# Patient Record
Sex: Male | Born: 1985 | Race: Black or African American | Hispanic: No | Marital: Single | Smoking: Current every day smoker
Health system: Southern US, Community
[De-identification: ages and names within clinical notes are randomized; demographics above are authoritative.]

## PROBLEM LIST (undated history)

## (undated) DIAGNOSIS — F1910 Other psychoactive substance abuse, uncomplicated: Secondary | ICD-10-CM

## (undated) DIAGNOSIS — S82309A Unspecified fracture of lower end of unspecified tibia, initial encounter for closed fracture: Secondary | ICD-10-CM

## (undated) DIAGNOSIS — S82831A Other fracture of upper and lower end of right fibula, initial encounter for closed fracture: Secondary | ICD-10-CM

## (undated) DIAGNOSIS — Z8619 Personal history of other infectious and parasitic diseases: Secondary | ICD-10-CM

## (undated) HISTORY — PX: OTHER SURGICAL HISTORY: SHX169

---

## 2011-12-27 ENCOUNTER — Encounter (HOSPITAL_BASED_OUTPATIENT_CLINIC_OR_DEPARTMENT_OTHER): Payer: Self-pay | Admitting: *Deleted

## 2011-12-27 ENCOUNTER — Emergency Department (HOSPITAL_BASED_OUTPATIENT_CLINIC_OR_DEPARTMENT_OTHER)
Admission: EM | Admit: 2011-12-27 | Discharge: 2011-12-27 | Disposition: A | Discharge status: Home / Self Care | Payer: Self-pay | Attending: Emergency Medicine | Admitting: Emergency Medicine

## 2011-12-27 DIAGNOSIS — R05 Cough: Secondary | ICD-10-CM | POA: Insufficient documentation

## 2011-12-27 DIAGNOSIS — R0602 Shortness of breath: Secondary | ICD-10-CM | POA: Insufficient documentation

## 2011-12-27 DIAGNOSIS — R0600 Dyspnea, unspecified: Secondary | ICD-10-CM

## 2011-12-27 DIAGNOSIS — F17200 Nicotine dependence, unspecified, uncomplicated: Secondary | ICD-10-CM | POA: Insufficient documentation

## 2011-12-27 DIAGNOSIS — Z77098 Contact with and (suspected) exposure to other hazardous, chiefly nonmedicinal, chemicals: Secondary | ICD-10-CM

## 2011-12-27 DIAGNOSIS — R059 Cough, unspecified: Secondary | ICD-10-CM | POA: Insufficient documentation

## 2011-12-27 LAB — POCT I-STAT 3, ART BLOOD GAS (G3+)
Acid-base deficit: 2 mmol/L (ref 0.0–2.0)
Bicarbonate: 22.5 meq/L (ref 20.0–24.0)
O2 Saturation: 99 %
Patient temperature: 98.7
TCO2: 24 mmol/L (ref 0–100)
pCO2 arterial: 36.1 mmHg (ref 35.0–45.0)
pH, Arterial: 7.403 (ref 7.350–7.450)
pO2, Arterial: 121 mmHg — ABNORMAL HIGH (ref 80.0–100.0)
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
## 2011-12-27 NOTE — ED Provider Notes (Signed)
History     CSN: [id number]  Arrival date & time 12/27/11  2012   First MD Initiated Contact with Patient 12/27/11 2024      Chief Complaint  Patient presents with  . Cough  . 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  There were a lot of fumes and it was very hot.  He became short of breath, light-headed, and felt as if he was going to pass out.  He reports one episode of vomiting.  Denies that any co-workers had similar complaints.    Patient is a 26 y.o. male presenting with cough and shortness of breath. The history is provided by the patient.  Cough This is a new problem. The current episode started 1 to 2 hours ago. The problem occurs constantly. The problem has been gradually worsening. Associated symptoms include shortness of breath.  Shortness of Breath  Associated symptoms include cough and shortness of breath.    History reviewed. No pertinent past medical history.  History reviewed. No pertinent past surgical history.  History reviewed. No pertinent family history.  History  Substance Use Topics  . Smoking status: Current Everyday Smoker  . Smokeless tobacco: Not on file  . Alcohol Use: No      Review of Systems  Respiratory: Positive for cough and shortness of breath.   All other systems reviewed and are negative.    Allergies  Review of patient's allergies indicates no known allergies.  Home Medications  No current outpatient prescriptions on file.  BP 148/98  Pulse 61  Temp(Src) 98.4 F (36.9 C) (Oral)  Resp 20  SpO2 100%  Physical Exam  Nursing note and vitals reviewed. Constitutional: He is oriented to person, place, and time. He appears well-developed and well-nourished. No distress.  HENT:  Head: Normocephalic and atraumatic.  Neck: Normal range of motion. Neck supple.  Cardiovascular:  Normal rate and regular rhythm.   No murmur heard. Pulmonary/Chest: Effort normal and breath sounds normal. No respiratory distress. He has no wheezes.  Abdominal: Soft. Bowel sounds are normal. He exhibits no distension. There is no tenderness.  Musculoskeletal: Normal range of motion.  Lymphadenopathy:    He has no cervical adenopathy.  Neurological: He is alert and oriented to person, place, and time.  Skin: Skin is warm and dry. He is not diaphoretic.    ED Course  Procedures (including critical care time)   Labs Reviewed  CARBOXYHEMOGLOBIN   No results found.   No diagnosis found.    MDM  Blood gas looks okay.  Doubt CO poisoning as no others ill.  Patient advised to make sure area is well-ventilated at work.  Return prn.        Geoffery Lyons, MD 12/27/11 [phone]
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
## 2011-12-30 LAB — POCT I-STAT 3, ART BLOOD GAS (G3+)
Bicarbonate: 23.7 mEq/L (ref 20.0–24.0)
pCO2 arterial: 37.2 mmHg (ref 35.0–45.0)
pH, Arterial: 7.413 (ref 7.350–7.450)
pO2, Arterial: 106 mmHg — ABNORMAL HIGH (ref 80.0–100.0)

## 2014-04-27 ENCOUNTER — Encounter (HOSPITAL_COMMUNITY): Payer: Self-pay | Admitting: Emergency Medicine

## 2014-04-27 ENCOUNTER — Emergency Department (HOSPITAL_COMMUNITY)
Admission: EM | Admit: 2014-04-27 | Discharge: 2014-04-27 | Disposition: A | Discharge status: Home / Self Care | Payer: Self-pay | Attending: Emergency Medicine | Admitting: Emergency Medicine

## 2014-04-27 DIAGNOSIS — A64 Unspecified sexually transmitted disease: Secondary | ICD-10-CM | POA: Insufficient documentation

## 2014-04-27 DIAGNOSIS — J029 Acute pharyngitis, unspecified: Secondary | ICD-10-CM | POA: Insufficient documentation

## 2014-04-27 DIAGNOSIS — R369 Urethral discharge, unspecified: Secondary | ICD-10-CM | POA: Insufficient documentation

## 2014-04-27 DIAGNOSIS — F17200 Nicotine dependence, unspecified, uncomplicated: Secondary | ICD-10-CM | POA: Insufficient documentation

## 2014-04-27 MED ORDER — DOXYCYCLINE HYCLATE 100 MG PO TABS: 100.0000 mg | ORAL_TABLET | Freq: Two times a day (BID) | ORAL | Status: DC

## 2014-04-27 MED ORDER — AZITHROMYCIN 250 MG PO TABS
1000.0000 mg | ORAL_TABLET | Freq: Once | ORAL | Status: AC
  Administered 2014-04-27: 1000 mg via ORAL
  Filled 2014-04-27: qty 4

## 2014-04-27 MED ORDER — STERILE WATER FOR INJECTION IJ SOLN
INTRAMUSCULAR | Status: AC
  Administered 2014-04-27: 10 mL
  Filled 2014-04-27: qty 10

## 2014-04-27 MED ORDER — CEFTRIAXONE SODIUM 250 MG IJ SOLR
250.0000 mg | Freq: Once | INTRAMUSCULAR | Status: AC
  Administered 2014-04-27: 250 mg via INTRAMUSCULAR
  Filled 2014-04-27: qty 250

## 2014-04-27 MED ORDER — DOXYCYCLINE HYCLATE 100 MG PO TABS
100.0000 mg | ORAL_TABLET | Freq: Once | ORAL | Status: AC
  Administered 2014-04-27: 100 mg via ORAL
  Filled 2014-04-27: qty 1

## 2014-04-27 NOTE — ED Provider Notes (Signed)
CSN: [id number]     Arrival date & time 04/27/14  1921 History   First MD Initiated Contact with Patient 04/27/14 1955   This chart was scribed for non-physician practitioner Earley FavorGail Shatavia Santor, NP, working with Hilario Quarryanielle S Ray, MD by Gwenevere AbbotAlexis Brown, ED scribe. This patient was seen in room WTR6/WTR6 and the patient's care was started at 8:05 PM.    Chief Complaint  Patient presents with  . Sore Throat  . SEXUALLY TRANSMITTED DISEASE   The history is provided by the patient. No language interpreter was used.   HPI Comments:  Evern CoreDevon Pinkerton is a 28 y.o. male who presents to the Emergency Department complaining of greenish white discharge from his penis with a burning sensation, onset 1 week ago. Pt states that he had unprotected relations recently, and was informed by partner that she has gonorrhea. Pt denies nausea, vomiting, or diarrhea.    History reviewed. No pertinent past medical history. History reviewed. No pertinent past surgical history. No family history on file. History  Substance Use Topics  . Smoking status: Current Every Day Smoker  . Smokeless tobacco: Not on file  . Alcohol Use: No    Review of Systems  Genitourinary: Positive for discharge.  All other systems reviewed and are negative.     Allergies  Review of patient's allergies indicates no known allergies.  Home Medications   Prior to Admission medications   Medication Sig Start Date End Date Taking? Authorizing Provider  doxycycline (VIBRA-TABS) 100 MG tablet Take 1 tablet (100 mg total) by mouth 2 (two) times daily. 04/27/14   Arman FilterGail K Tharon Bomar, NP   BP 144/81  Pulse 114  Temp(Src) 98.4 F (36.9 C) (Oral)  Resp 16  Ht 5\' 6"  (1.676 m)  Wt 185 lb (83.915 kg)  BMI 29.87 kg/m2  SpO2 100% Physical Exam  Nursing note and vitals reviewed. Constitutional: He is oriented to person, place, and time. He appears well-developed and well-nourished.  HENT:  Head: Normocephalic and atraumatic.  Eyes: EOM are normal.   Neck: Normal range of motion. Neck supple.  Cardiovascular: Normal rate.   Pulmonary/Chest: Effort normal.  Musculoskeletal: Normal range of motion.  Neurological: He is alert and oriented to person, place, and time.  Skin: Skin is warm and dry.  Psychiatric: He has a normal mood and affect. His behavior is normal.    ED Course  Procedures  DIAGNOSTIC STUDIES: Oxygen Saturation is 100% on RA, normal by my interpretation.  COORDINATION OF CARE: 8:10 PM-Discussed treatment plan with pt at bedside and pt agreed to plan.  Labs Review Labs Reviewed  GC/CHLAMYDIA PROBE AMP  RPR  HIV ANTIBODY (ROUTINE TESTING)    Imaging Review No results found.   EKG Interpretation None      MDM   Final diagnoses:  STD (male)    I personally performed the services described in this documentation, which was scribed in my presence. The recorded information has been reviewed and is accurate.     Arman FilterGail K Antonisha Waskey, NP 04/27/14 2122

## 2014-04-27 NOTE — ED Notes (Signed)
Pt presents with NAD. Pt reports greenish white discharge from penis with burning sensation x 1 week. Pt reports unprotected relations recently. Pt reports he has been contacted by a partner and was told she had gonorrhea. Denies N/V/D and fever. Pt c/o of HA

## 2014-04-27 NOTE — Discharge Instructions (Signed)
Sexually Transmitted Disease °A sexually transmitted disease (STD) is a disease or infection that may be passed (transmitted) from person to person, usually during sexual activity. This may happen by way of saliva, semen, blood, vaginal mucus, or urine. Common STDs include:  °· Gonorrhea.   °· Chlamydia.   °· Syphilis.   °· HIV and AIDS.   °· Genital herpes.   °· Hepatitis B and C.   °· Trichomonas.   °· Human papillomavirus (HPV).   °· Pubic lice.   °· Scabies. °· Mites. °· Bacterial vaginosis. °WHAT ARE CAUSES OF STDs? °An STD may be caused by bacteria, a virus, or parasites. STDs are often transmitted during sexual activity if one person is infected. However, they may also be transmitted through nonsexual means. STDs may be transmitted after:  °· Sexual intercourse with an infected person.   °· Sharing sex toys with an infected person.   °· Sharing needles with an infected person or using unclean piercing or tattoo needles. °· Having intimate contact with the genitals, mouth, or rectal areas of an infected person.   °· Exposure to infected fluids during birth. °WHAT ARE THE SIGNS AND SYMPTOMS OF STDs? °Different STDs have different symptoms. Some people may not have any symptoms. If symptoms are present, they may include:  °· Painful or bloody urination.   °· Pain in the pelvis, abdomen, vagina, anus, throat, or eyes.   °· A skin rash, itching, or irritation. °· Growths, ulcerations, blisters, or sores in the genital and anal areas. °· Abnormal vaginal discharge with or without bad odor.   °· Penile discharge in men.   °· Fever.   °· Pain or bleeding during sexual intercourse.   °· Swollen glands in the groin area.   °· Yellow skin and eyes (jaundice). This is seen with hepatitis.   °· Swollen testicles. °· Infertility. °· Sores and blisters in the mouth. °HOW ARE STDs DIAGNOSED? °To make a diagnosis, your health care provider may:  °· Take a medical history.   °· Perform a physical exam.   °· Take a sample of  any discharge to examine. °· Swab the throat, cervix, opening to the penis, rectum, or vagina for testing. °· Test a sample of your first morning urine.   °· Perform blood tests.   °· Perform a Pap test, if this applies.   °· Perform a colposcopy.   °· Perform a laparoscopy.   °HOW ARE STDs TREATED? ° Treatment depends on the STD. Some STDs may be treated but not cured.  °· Chlamydia, gonorrhea, trichomonas, and syphilis can be cured with antibiotic medicine.   °· Genital herpes, hepatitis, and HIV can be treated, but not cured, with prescribed medicines. The medicines lessen symptoms.   °· Genital warts from HPV can be treated with medicine or by freezing, burning (electrocautery), or surgery. Warts may come back.   °· HPV cannot be cured with medicine or surgery. However, abnormal areas may be removed from the cervix, vagina, or vulva.   °· If your diagnosis is confirmed, your recent sexual partners need treatment. This is true even if they are symptom-free or have a negative culture or evaluation. They should not have sex until their health care providers say it is okay. °HOW CAN I REDUCE MY RISK OF GETTING AN STD? °Take these steps to reduce your risk of getting an STD: °· Use latex condoms, dental dams, and water-soluble lubricants during sexual activity. Do not use petroleum jelly or oils. °· Avoid having multiple sex partners. °· Do not have sex with someone who has other sex partners. °· Do not have sex with anyone you do not know or who is at   high risk for an STD.  Avoid risky sex practices that can break your skin.  Do not have sex if you have open sores on your mouth or skin.  Avoid drinking too much alcohol or taking illegal drugs. Alcohol and drugs can affect your judgment and put you in a vulnerable position.  Avoid engaging in oral and anal sex acts.  Get vaccinated for HPV and hepatitis. If you have not received these vaccines in the past, talk to your health care provider about whether one  or both might be right for you.   If you are at risk of being infected with HIV, it is recommended that you take a prescription medicine daily to prevent HIV infection. This is called pre-exposure prophylaxis (PrEP). You are considered at risk if:  You are a man who has sex with other men (MSM).  You are a heterosexual man or woman and are sexually active with more than one partner.  You take drugs by injection.  You are sexually active with a partner who has HIV.  Talk with your health care provider about whether you are at high risk of being infected with HIV. If you choose to begin PrEP, you should first be tested for HIV. You should then be tested every 3 months for as long as you are taking PrEP.  WHAT SHOULD I DO IF I THINK I HAVE AN STD?  See your health care provider.   Tell your sexual partner(s). They should be tested and treated for any STDs.  Do not have sex until your health care provider says it is okay. WHEN SHOULD I GET IMMEDIATE MEDICAL CARE? Contact your health care provider right away if:   You have severe abdominal pain.  You are a man and notice swelling or pain in your testicles.  You are a woman and notice swelling or pain in your vagina. Document Released: 11/19/2002 Document Revised: 09/03/2013 Document Reviewed: 03/19/2013 [facility] Patient Information 2015 [place], [place]. This information is not intended to replace advice given to you by your health care provider. Make sure you discuss any questions you have with your health care provider. Your have been treated for chlamydia and gonorrhea and test for syphilis and HIV  Please fill the prescription and take all the tablets

## 2014-04-28 LAB — HIV ANTIBODY (ROUTINE TESTING W REFLEX): HIV: NONREACTIVE

## 2014-04-28 LAB — RPR

## 2014-04-28 NOTE — ED Provider Notes (Signed)
History/physical exam/procedure(s) were performed by non-physician practitioner and as supervising physician I was immediately available for consultation/collaboration. I have reviewed all notes and am in agreement with care and plan.   Jenika Chiem S Malaiah Viramontes, MD 04/28/14 1153 

## 2014-04-30 LAB — GC/CHLAMYDIA PROBE AMP
CT Probe RNA: NEGATIVE
GC Probe RNA: POSITIVE — AB

## 2014-05-02 ENCOUNTER — Telehealth (HOSPITAL_BASED_OUTPATIENT_CLINIC_OR_DEPARTMENT_OTHER): Payer: Self-pay

## 2014-05-02 NOTE — Telephone Encounter (Signed)
[facility].  (+) Gonorrhea  Treated with Zithromax and Rocephin and given Rx for Doxycycline.Marland Kitchen.  [facility]HS form completed and faxed.  Call and notify patient.

## 2014-05-03 ENCOUNTER — Telehealth (HOSPITAL_BASED_OUTPATIENT_CLINIC_OR_DEPARTMENT_OTHER): Payer: Self-pay | Admitting: Emergency Medicine

## 2014-05-03 NOTE — Telephone Encounter (Signed)
Pt returned call, ID verified x three. Patient notified of + Gonorrhea and that treatment was given while in ED with Rocephin, Zithromax and prescription for Doxycycline. STD instructions provided, patient verbalized understanding.

## 2014-11-14 ENCOUNTER — Emergency Department (HOSPITAL_COMMUNITY)
Admission: EM | Admit: 2014-11-14 | Discharge: 2014-11-14 | Disposition: A | Discharge status: Home / Self Care | Payer: Self-pay | Attending: Emergency Medicine | Admitting: Emergency Medicine

## 2014-11-14 ENCOUNTER — Encounter (HOSPITAL_COMMUNITY): Payer: Self-pay | Admitting: Family Medicine

## 2014-11-14 DIAGNOSIS — H9201 Otalgia, right ear: Secondary | ICD-10-CM | POA: Insufficient documentation

## 2014-11-14 DIAGNOSIS — Z792 Long term (current) use of antibiotics: Secondary | ICD-10-CM | POA: Insufficient documentation

## 2014-11-14 DIAGNOSIS — R51 Headache: Secondary | ICD-10-CM | POA: Insufficient documentation

## 2014-11-14 DIAGNOSIS — J0300 Acute streptococcal tonsillitis, unspecified: Secondary | ICD-10-CM | POA: Insufficient documentation

## 2014-11-14 DIAGNOSIS — Z720 Tobacco use: Secondary | ICD-10-CM | POA: Insufficient documentation

## 2014-11-14 LAB — RAPID STREP SCREEN (MED CTR MEBANE ONLY): Streptococcus, Group A Screen (Direct): POSITIVE — AB

## 2014-11-14 MED ORDER — IBUPROFEN 800 MG PO TABS: 800.0000 mg | ORAL_TABLET | Freq: Three times a day (TID) | ORAL | Status: DC

## 2014-11-14 MED ORDER — PENICILLIN V POTASSIUM 500 MG PO TABS: 500.0000 mg | ORAL_TABLET | Freq: Four times a day (QID) | ORAL | Status: AC

## 2014-11-14 NOTE — Discharge Instructions (Signed)

## 2014-11-14 NOTE — ED Notes (Signed)
C/o sore throat x 3 days. States also has right ear pain.

## 2014-11-14 NOTE — ED Notes (Signed)
Pt here for sore throat and ear ache x 3 days. sts hurts to swallow. Hx of strep

## 2014-11-14 NOTE — ED Provider Notes (Signed)
CSN: [id number]     Arrival date & time 11/14/14  1144 History  This chart was scribed for non-physician practitioner, Langston MaskerKaren Lark Langenfeld, PA-C, working with Arby BarretteMarcy Pfeiffer, MD by Charline BillsEssence Howell, ED Scribe. This patient was seen in room TR06C/TR06C and the patient's care was started at 12:47 PM.   Chief Complaint  Patient presents with  . Sore Throat  . Otalgia   The history is provided by the patient. No language interpreter was used.   HPI Comments: [name] CoreDevon Plass is a 29 y.o. male who presents to the Emergency Department complaining of persistent sore throat for the past 3 days. Pain is exacerbated with swallowing. Pt reports associated R ear pain and R sided HA. He denies fever. He reports that his brother was recently diagnosed with the flu. Pt has been treating with Tylenol with mild relief. No known allergies.  History reviewed. No pertinent past medical history. History reviewed. No pertinent past surgical history. History reviewed. No pertinent family history. History  Substance Use Topics  . Smoking status: Current Every Day Smoker  . Smokeless tobacco: Not on file  . Alcohol Use: No    Review of Systems  Constitutional: Negative for fever.  HENT: Positive for ear pain and sore throat.   Neurological: Positive for headaches.   Allergies  Review of patient's allergies indicates no known allergies.  Home Medications   Prior to Admission medications   Medication Sig Start Date End Date Taking? Authorizing Provider  doxycycline (VIBRA-TABS) 100 MG tablet Take 1 tablet (100 mg total) by mouth 2 (two) times daily. 04/27/14   Arman FilterGail K Schulz, NP   BP 109/86 mmHg  Pulse 77  Temp(Src) 98.3 F (36.8 C) (Oral)  Resp 14  SpO2 100% Physical Exam  Constitutional: He is oriented to person, place, and time. He appears well-developed and well-nourished. No distress.  HENT:  Head: Normocephalic and atraumatic.  Right Ear: Tympanic membrane is erythematous and bulging.  Left Ear: Tympanic  membrane normal.  Mouth/Throat: Oropharyngeal exudate and posterior oropharyngeal edema present.  Injected red throat.   Eyes: Conjunctivae and EOM are normal.  Neck: Neck supple. No tracheal deviation present.  Cardiovascular: Normal rate.   Pulmonary/Chest: Effort normal. No respiratory distress.  Musculoskeletal: Normal range of motion.  Neurological: He is alert and oriented to person, place, and time.  Skin: Skin is warm and dry.  Psychiatric: He has a normal mood and affect. His behavior is normal.  Nursing note and vitals reviewed.  ED Course  Procedures (including critical care time) DIAGNOSTIC STUDIES: Oxygen Saturation is 100% on RA, normal by my interpretation.    COORDINATION OF CARE: 12:49 PM-Discussed treatment plan which includes strep screen and Veetid with pt at bedside and pt agreed to plan.   Labs Review Labs Reviewed  RAPID STREP SCREEN - Abnormal; Notable for the following:    Streptococcus, Group A Screen (Direct) POSITIVE (*)    All other components within normal limits   Imaging Review No results found.   EKG Interpretation None      MDM   Final diagnoses:  Strep tonsillitis   Pcn vk 500 40  Ibuprofen    Lonia SkinnerLeslie K CoultervilleSofia, PA-C 11/14/14 1258  Lonia SkinnerLeslie K Four BridgesSofia, PA-C 11/14/14 1258  [address]Leslie K SpencervilleSofia, [place] JerseyPA-C 11/14/14 1529  Arby BarretteMarcy Pfeiffer, MD 11/14/14 [phone]

## 2014-11-22 ENCOUNTER — Emergency Department (HOSPITAL_COMMUNITY): Payer: No Typology Code available for payment source

## 2014-11-22 ENCOUNTER — Observation Stay (HOSPITAL_COMMUNITY): Payer: No Typology Code available for payment source

## 2014-11-22 ENCOUNTER — Encounter (HOSPITAL_COMMUNITY): Payer: Self-pay | Admitting: Emergency Medicine

## 2014-11-22 ENCOUNTER — Emergency Department (HOSPITAL_COMMUNITY): Payer: Self-pay

## 2014-11-22 ENCOUNTER — Observation Stay (HOSPITAL_COMMUNITY)
Admission: EM | Admit: 2014-11-22 | Discharge: 2014-11-23 | Disposition: A | Discharge status: Home / Self Care | Payer: Self-pay | Attending: Orthopedic Surgery | Admitting: Orthopedic Surgery

## 2014-11-22 DIAGNOSIS — F1910 Other psychoactive substance abuse, uncomplicated: Secondary | ICD-10-CM | POA: Insufficient documentation

## 2014-11-22 DIAGNOSIS — F17210 Nicotine dependence, cigarettes, uncomplicated: Secondary | ICD-10-CM | POA: Insufficient documentation

## 2014-11-22 DIAGNOSIS — S82301A Unspecified fracture of lower end of right tibia, initial encounter for closed fracture: Principal | ICD-10-CM | POA: Insufficient documentation

## 2014-11-22 DIAGNOSIS — Y92410 Unspecified street and highway as the place of occurrence of the external cause: Secondary | ICD-10-CM | POA: Insufficient documentation

## 2014-11-22 DIAGNOSIS — E889 Metabolic disorder, unspecified: Secondary | ICD-10-CM | POA: Insufficient documentation

## 2014-11-22 DIAGNOSIS — Z01818 Encounter for other preprocedural examination: Secondary | ICD-10-CM

## 2014-11-22 DIAGNOSIS — S82309A Unspecified fracture of lower end of unspecified tibia, initial encounter for closed fracture: Secondary | ICD-10-CM

## 2014-11-22 DIAGNOSIS — S82831A Other fracture of upper and lower end of right fibula, initial encounter for closed fracture: Secondary | ICD-10-CM | POA: Insufficient documentation

## 2014-11-22 HISTORY — DX: Other fracture of upper and lower end of right fibula, initial encounter for closed fracture: S82.831A

## 2014-11-22 HISTORY — DX: Personal history of other infectious and parasitic diseases: Z86.19

## 2014-11-22 HISTORY — DX: Unspecified fracture of lower end of unspecified tibia, initial encounter for closed fracture: S82.309A

## 2014-11-22 HISTORY — DX: Other psychoactive substance abuse, uncomplicated: F19.10

## 2014-11-22 MED ORDER — MORPHINE SULFATE 4 MG/ML IJ SOLN
4.0000 mg | Freq: Once | INTRAMUSCULAR | Status: AC
  Administered 2014-11-22: 4 mg via INTRAVENOUS
  Filled 2014-11-22: qty 1

## 2014-11-22 MED ORDER — METHOCARBAMOL 1000 MG/10ML IJ SOLN
1000.0000 mg | Freq: Once | INTRAVENOUS | Status: DC
  Filled 2014-11-22: qty 10

## 2014-11-22 MED ORDER — METHOCARBAMOL 1000 MG/10ML IJ SOLN: 1000.0000 mg | Freq: Four times a day (QID) | INTRAVENOUS | Status: DC | PRN

## 2014-11-22 MED ORDER — HYDROCODONE-ACETAMINOPHEN 5-325 MG PO TABS
1.0000 | ORAL_TABLET | Freq: Four times a day (QID) | ORAL | Status: DC | PRN
  Administered 2014-11-23 (×2): 2 via ORAL
  Filled 2014-11-22 (×2): qty 2

## 2014-11-22 MED ORDER — HYDROMORPHONE HCL 1 MG/ML IJ SOLN
1.0000 mg | Freq: Once | INTRAMUSCULAR | Status: AC
  Administered 2014-11-22: 1 mg via INTRAVENOUS
  Filled 2014-11-22: qty 1

## 2014-11-22 MED ORDER — OXYCODONE-ACETAMINOPHEN 5-325 MG PO TABS
1.0000 | ORAL_TABLET | Freq: Four times a day (QID) | ORAL | Status: DC | PRN
  Administered 2014-11-23: 2 via ORAL
  Filled 2014-11-22: qty 2

## 2014-11-22 MED ORDER — METHOCARBAMOL 500 MG PO TABS
500.0000 mg | ORAL_TABLET | Freq: Four times a day (QID) | ORAL | Status: DC | PRN
  Administered 2014-11-23 (×2): 1000 mg via ORAL
  Filled 2014-11-22 (×2): qty 2

## 2014-11-22 MED ORDER — LACTATED RINGERS IV SOLN: INTRAVENOUS | Status: DC

## 2014-11-22 MED ORDER — MORPHINE SULFATE 2 MG/ML IJ SOLN
0.5000 mg | INTRAMUSCULAR | Status: DC | PRN
Start: 2014-11-22 — End: 2014-11-23

## 2014-11-22 MED ORDER — DOCUSATE SODIUM 100 MG PO CAPS
100.0000 mg | ORAL_CAPSULE | Freq: Two times a day (BID) | ORAL | Status: DC
  Filled 2014-11-22: qty 1

## 2014-11-22 MED ORDER — KETOROLAC TROMETHAMINE 30 MG/ML IJ SOLN
30.0000 mg | Freq: Once | INTRAMUSCULAR | Status: AC
  Administered 2014-11-22: 30 mg via INTRAVENOUS
  Filled 2014-11-22: qty 1

## 2014-11-22 NOTE — ED Notes (Signed)
Patient requesting pain medication. PA notified. 
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
## 2014-11-22 NOTE — Progress Notes (Signed)
Orthopedic Tech Progress Note Patient Details:  [name] Hart [birth year] [id number] Applied fiberglass posterior long leg splint and fiberglass stirrup splint to RLE.  Pulses, sensation, motion intact before and after splinting.  Capillary refill less than 2 seconds before and after splinting. Ortho Devices Type of Ortho Device: Post (long leg) splint, Stirrup splint Ortho Device/Splint Location: RLE Ortho Device/Splint Interventions: Application   Lesle ChrisGilliland, Bethann Qualley L 11/22/2014, 10:46 PM
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
## 2014-11-22 NOTE — ED Notes (Signed)
Pedial Pulses noted.

## 2014-11-22 NOTE — ED Notes (Signed)
Ice applied to R ankle by EMS
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
## 2014-11-22 NOTE — ED Notes (Signed)
Spoke with Ortho Tech stated it would be 20 Mins before they come.

## 2014-11-22 NOTE — ED Notes (Signed)
Patient received 150 mcg of Fentyl with EMS

## 2014-11-23 ENCOUNTER — Observation Stay (HOSPITAL_COMMUNITY): Payer: Self-pay

## 2014-11-23 ENCOUNTER — Encounter (HOSPITAL_COMMUNITY): Payer: Self-pay | Admitting: *Deleted

## 2014-11-23 ENCOUNTER — Other Ambulatory Visit: Payer: Self-pay

## 2014-11-23 DIAGNOSIS — S82831A Other fracture of upper and lower end of right fibula, initial encounter for closed fracture: Secondary | ICD-10-CM

## 2014-11-23 DIAGNOSIS — F1910 Other psychoactive substance abuse, uncomplicated: Secondary | ICD-10-CM | POA: Diagnosis present

## 2014-11-23 HISTORY — DX: Other fracture of upper and lower end of right fibula, initial encounter for closed fracture: S82.831A

## 2014-11-23 LAB — SURGICAL PCR SCREEN
MRSA, PCR: NEGATIVE
Staphylococcus aureus: NEGATIVE

## 2014-11-23 LAB — COMPREHENSIVE METABOLIC PANEL
ALK PHOS: 53 U/L (ref 39–117)
ALT: 19 U/L (ref 0–53)
AST: 21 U/L (ref 0–37)
Albumin: 3.5 g/dL (ref 3.5–5.2)
Anion gap: 9 (ref 5–15)
BILIRUBIN TOTAL: 0.6 mg/dL (ref 0.3–1.2)
BUN: 14 mg/dL (ref 6–23)
CHLORIDE: 104 mmol/L (ref 96–112)
CO2: 25 mmol/L (ref 19–32)
Calcium: 9 mg/dL (ref 8.4–10.5)
Creatinine, Ser: 1.11 mg/dL (ref 0.50–1.35)
GFR, EST NON AFRICAN AMERICAN: 88 mL/min — AB (ref >90)
Glucose, Bld: 88 mg/dL (ref 70–99)
POTASSIUM: 4.2 mmol/L (ref 3.5–5.1)
SODIUM: 138 mmol/L (ref 135–145)
Total Protein: 6.7 g/dL (ref 6.0–8.3)

## 2014-11-23 LAB — URINALYSIS, ROUTINE W REFLEX MICROSCOPIC
BILIRUBIN URINE: NEGATIVE
Glucose, UA: NEGATIVE mg/dL
HGB URINE DIPSTICK: NEGATIVE
Ketones, ur: NEGATIVE mg/dL
Leukocytes, UA: NEGATIVE
NITRITE: NEGATIVE
PH: 5.5 (ref 5.0–8.0)
PROTEIN: NEGATIVE mg/dL
Specific Gravity, Urine: 1.028 (ref 1.005–1.030)
Urobilinogen, UA: 0.2 mg/dL (ref 0.0–1.0)

## 2014-11-23 LAB — TESTOSTERONE: Testosterone: 101 ng/dL — ABNORMAL LOW (ref 300–890)

## 2014-11-23 LAB — RAPID URINE DRUG SCREEN, HOSP PERFORMED
AMPHETAMINES: NOT DETECTED
Barbiturates: NOT DETECTED
Benzodiazepines: NOT DETECTED
COCAINE: NOT DETECTED
OPIATES: POSITIVE — AB
Tetrahydrocannabinol: POSITIVE — AB

## 2014-11-23 MED ORDER — OXYCODONE HCL 5 MG PO TABS: 5.0000 mg | ORAL_TABLET | Freq: Four times a day (QID) | ORAL | Status: DC | PRN

## 2014-11-23 MED ORDER — ASPIRIN EC 325 MG PO TBEC
325.0000 mg | DELAYED_RELEASE_TABLET | Freq: Every day | ORAL | Status: DC
  Administered 2014-11-23: 325 mg via ORAL
  Filled 2014-11-23: qty 1

## 2014-11-23 MED ORDER — INFLUENZA VAC SPLIT QUAD 0.5 ML IM SUSY: 0.5000 mL | PREFILLED_SYRINGE | INTRAMUSCULAR | Status: DC

## 2014-11-23 MED ORDER — OXYCODONE-ACETAMINOPHEN 5-325 MG PO TABS: 1.0000 | ORAL_TABLET | Freq: Four times a day (QID) | ORAL | Status: DC | PRN

## 2014-11-23 MED ORDER — DOCUSATE SODIUM 100 MG PO CAPS: 100.0000 mg | ORAL_CAPSULE | Freq: Two times a day (BID) | ORAL | Status: DC

## 2014-11-23 MED ORDER — ASPIRIN 325 MG PO TBEC
325.0000 mg | DELAYED_RELEASE_TABLET | Freq: Every day | ORAL | Status: DC
Start: 2014-11-23 — End: 2015-04-17

## 2014-11-23 MED ORDER — METHOCARBAMOL 500 MG PO TABS: 500.0000 mg | ORAL_TABLET | Freq: Four times a day (QID) | ORAL | Status: DC | PRN

## 2014-11-23 MED ORDER — PNEUMOCOCCAL VAC POLYVALENT 25 MCG/0.5ML IJ INJ: 0.5000 mL | INJECTION | INTRAMUSCULAR | Status: DC
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
## 2014-11-23 NOTE — Discharge Instructions (Signed)
Orthopaedic Trauma Service Discharge Instructions   General Discharge Instructions  WEIGHT BEARING STATUS: Nonweightbearing Right Leg  RANGE OF MOTION/ACTIVITY: No ankle range of motion R ankle.  Elevate R leg/ankle as much as possible. Elevate above heart. Ice to R ankle as well   Diet: as you were eating previously.  Can use over the counter stool softeners and bowel preparations, such as Miralax, to help with bowel movements.  Narcotics can be constipating.  Be sure to drink plenty of fluids  STOP SMOKING OR USING NICOTINE PRODUCTS!!!!  As discussed nicotine severely impairs your body's ability to heal surgical and traumatic wounds but also impairs bone healing.  Wounds and bone heal by forming microscopic blood vessels (angiogenesis) and nicotine is a vasoconstrictor (essentially, shrinks blood vessels).  Therefore, if vasoconstriction occurs to these microscopic blood vessels they essentially disappear and are unable to deliver necessary nutrients to the healing tissue.  This is one modifiable factor that you can do to dramatically increase your chances of healing your injury.    (This means no smoking, no nicotine gum, patches, etc)  DO NOT USE NONSTEROIDAL ANTI-INFLAMMATORY DRUGS (NSAID'S)  Using products such as Advil (ibuprofen), Aleve (naproxen), Motrin (ibuprofen) for additional pain control during fracture healing can delay and/or prevent the healing response.  If you would like to take over the counter (OTC) medication, Tylenol (acetaminophen) is ok.  However, some narcotic medications that are given for pain control contain acetaminophen as well. Therefore, you should not exceed more than 4000 mg of tylenol in a day if you do not have liver disease.  Also note that there are may OTC medicines, such as cold medicines and allergy medicines that my contain tylenol as well.  If you have any questions about medications and/or interactions please ask your doctor/PA or your pharmacist.    PAIN MEDICATION USE AND EXPECTATIONS  You have likely been given narcotic medications to help control your pain.  After a traumatic event that results in an fracture (broken bone) with or without surgery, it is ok to use narcotic pain medications to help control one's pain.  We understand that everyone responds to pain differently and each individual patient will be evaluated on a regular basis for the continued need for narcotic medications. Ideally, narcotic medication use should last no more than 6-8 weeks (coinciding with fracture healing).   As a patient it is your responsibility as well to monitor narcotic medication use and report the amount and frequency you use these medications when you come to your office visit.   We would also advise that if you are using narcotic medications, you should take a dose prior to therapy to maximize you participation.  IF YOU ARE ON NARCOTIC MEDICATIONS IT IS NOT PERMISSIBLE TO OPERATE A MOTOR VEHICLE (MOTORCYCLE/CAR/TRUCK/MOPED) OR HEAVY MACHINERY DO NOT MIX NARCOTICS WITH OTHER CNS (CENTRAL NERVOUS SYSTEM) DEPRESSANTS SUCH AS ALCOHOL       ICE AND ELEVATE INJURED/OPERATIVE EXTREMITY  Using ice and elevating the injured extremity above your heart can help with swelling and pain control.  Icing in a pulsatile fashion, such as 20 minutes on and 20 minutes off, can be followed.    Do not place ice directly on skin. Make sure there is a barrier between to skin and the ice pack.    Using frozen items such as frozen peas works well as the conform nicely to the are that needs to be iced.  USE AN ACE WRAP OR TED HOSE FOR SWELLING CONTROL  In addition to icing and elevation, Ace wraps or TED hose are used to help limit and resolve swelling.  It is recommended to use Ace wraps or TED hose until you are informed to stop.    When using Ace Wraps start the wrapping distally (farthest away from the body) and wrap proximally (closer to the body)   Example: If you had  surgery on your leg or thing and you do not have a splint on, start the ace wrap at the toes and work your way up to the thigh        If you had surgery on your upper extremity and do not have a splint on, start the ace wrap at your fingers and work your way up to the upper arm  IF YOU ARE IN A SPLINT OR CAST DO NOT REMOVE IT FOR ANY REASON   If your splint gets wet for any reason please contact the office immediately. You may shower in your splint or cast as long as you keep it dry.  This can be done by wrapping in a cast cover or garbage back (or similar)  Do Not stick any thing down your splint or cast such as pencils, money, or hangers to try and scratch yourself with.  If you feel itchy take benadryl as prescribed on the bottle for itching  IF YOU ARE IN A CAM BOOT (BLACK BOOT)  You may remove boot periodically. Perform daily dressing changes as noted below.  Wash the liner of the boot regularly and wear a sock when wearing the boot. It is recommended that you sleep in the boot until told otherwise  CALL THE OFFICE WITH ANY QUESTIONS OR CONCERTS: [phone]

## 2014-11-23 NOTE — Progress Notes (Signed)
Pt. has been evaluated and cleared by both PT and OT. Ortho tech delivered crutches. Pt. is ready for discharge.

## 2014-11-23 NOTE — Progress Notes (Signed)
Orthopedic Tech Progress Note Patient Details:  [name] CoreDevon Hart 1986-07-25 [id number]  Ortho Devices Type of Ortho Device: Crutches Ortho Device/Splint Location: RLE Ortho Device/Splint Interventions: Ordered   Asia Burnett KanarisR Thompson 11/23/2014, 2:33 PM
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
## 2014-11-23 NOTE — Progress Notes (Signed)
Utilization review completed.  P.J. Khadeem Rockett,RN,BSN Case Manager 
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
## 2014-11-24 LAB — TESTOSTERONE, FREE: Testosterone, Free: 25.9 pg/mL — ABNORMAL LOW (ref 47.0–244.0)

## 2014-11-24 LAB — SEX HORMONE BINDING GLOBULIN: Sex Hormone Binding: 17 nmol/L (ref 10–50)

## 2014-11-24 LAB — TESTOSTERONE, % FREE: Testosterone-% Free: 2.6 % — ABNORMAL HIGH (ref 1.6–2.9)

## 2014-11-25 LAB — VITAMIN D 25 HYDROXY (VIT D DEFICIENCY, FRACTURES): Vit D, 25-Hydroxy: 8 ng/mL — ABNORMAL LOW (ref 30–100)

## 2014-11-30 NOTE — Progress Notes (Addendum)
Physical Therapy Note  (Late entry for G Code correction)   11/30/14 0800  PT G-Codes **NOT FOR INPATIENT CLASS**  Functional Assessment Tool Used Clinical Judgement  Functional Limitation Mobility: Walking and moving around  Mobility: Walking and Moving Around Current Status (W0981(G8978) CI  Mobility: Walking and Moving Around Goal Status [phone](G8979) CH  Mobility: Walking and Moving Around Discharge Status (W2956(G8980) CI    Van ClinesHolly Sherilee Smotherman, PT  Acute Rehabilitation Services Pager [phone] Office [phone]

## 2014-12-02 ENCOUNTER — Emergency Department (HOSPITAL_COMMUNITY)
Admission: EM | Admit: 2014-12-02 | Discharge: 2014-12-02 | Disposition: A | Discharge status: Home / Self Care | Payer: Self-pay | Attending: Emergency Medicine | Admitting: Emergency Medicine

## 2014-12-02 ENCOUNTER — Encounter (HOSPITAL_COMMUNITY): Payer: Self-pay | Admitting: Neurology

## 2014-12-02 DIAGNOSIS — Z8619 Personal history of other infectious and parasitic diseases: Secondary | ICD-10-CM | POA: Insufficient documentation

## 2014-12-02 DIAGNOSIS — Z79899 Other long term (current) drug therapy: Secondary | ICD-10-CM | POA: Insufficient documentation

## 2014-12-02 DIAGNOSIS — Z4789 Encounter for other orthopedic aftercare: Secondary | ICD-10-CM | POA: Insufficient documentation

## 2014-12-02 DIAGNOSIS — Z09 Encounter for follow-up examination after completed treatment for conditions other than malignant neoplasm: Secondary | ICD-10-CM

## 2014-12-02 DIAGNOSIS — Z720 Tobacco use: Secondary | ICD-10-CM | POA: Insufficient documentation

## 2014-12-02 DIAGNOSIS — Z7982 Long term (current) use of aspirin: Secondary | ICD-10-CM | POA: Insufficient documentation

## 2014-12-02 DIAGNOSIS — Z8781 Personal history of (healed) traumatic fracture: Secondary | ICD-10-CM | POA: Insufficient documentation

## 2014-12-02 NOTE — ED Notes (Signed)
Pt reports right leg pain, has fracture to right lower leg was supposed to have surgery today but couldn't pay the $ 400 upfront so is here today pain control and resources to get surgery.

## 2014-12-02 NOTE — ED Provider Notes (Signed)
CSN: [id number]     Arrival date & time 12/02/14  1033 History  This chart was scribed for non-physician practitioner Emilia BeckKaitlyn Kyian Obst, working with No att. providers found by Carl Bestelina Holson, ED Scribe. This patient was seen in room TR05C/TR05C and the patient's care was started at 11:13 AM.   Chief Complaint  Patient presents with  . Leg Pain   HPI Comments: Patient presents today wanting resources. He has a broken leg and was supposed to have surgery today. He could not afford the $400 payment up front so he did not go to the surgery. Patient not having any complaints at this time. He has pain medication for his leg. No other symptoms.   Patient is a 29 y.o. male presenting with leg pain. The history is provided by the patient. No language interpreter was used.  Leg Pain  HPI Comments: Evern CoreDevon Pew is a [age] y.o. male who presents to the Emergency Department   Past Medical History  Diagnosis Date  . Polysubstance abuse   . Fracture of distal end of tibia 11/22/2014  . Fracture of right proximal fibula 11/23/2014  . History of gonorrhea     treated august 2015   Past Surgical History  Procedure Laterality Date  . Crpp left wrist      As a kid   No family history on file. History  Substance Use Topics  . Smoking status: Current Every Day Smoker  . Smokeless tobacco: Not on file  . Alcohol Use: 0.0 oz/week    0 Standard drinks or equivalent per week    Review of Systems  Musculoskeletal: Positive for arthralgias.  All other systems reviewed and are negative.     Allergies  Review of patient's allergies indicates no known allergies.  Home Medications   Prior to Admission medications   Medication Sig Start Date End Date Taking? Authorizing Provider  aspirin EC 325 MG EC tablet Take 1 tablet (325 mg total) by mouth daily. 11/23/14   Montez MoritaKeith Paul, PA-C  docusate sodium (COLACE) 100 MG capsule Take 1 capsule (100 mg total) by mouth 2 (two) times daily. 11/23/14   Montez MoritaKeith Paul,  PA-C  methocarbamol (ROBAXIN) 500 MG tablet Take 1-2 tablets (500-1,000 mg total) by mouth every 6 (six) hours as needed for muscle spasms. 11/23/14   Montez MoritaKeith Paul, PA-C  oxyCODONE (OXY IR/ROXICODONE) 5 MG immediate release tablet Take 1-2 tablets (5-10 mg total) by mouth every 6 (six) hours as needed for severe pain or breakthrough pain. 11/23/14   Montez MoritaKeith Paul, PA-C  oxyCODONE-acetaminophen (PERCOCET/ROXICET) 5-325 MG per tablet Take 1-2 tablets by mouth every 6 (six) hours as needed for moderate pain or severe pain. 11/23/14   Montez MoritaKeith Paul, PA-C   Triage Vitals: BP 147/93 mmHg  Pulse 87  Temp(Src) 98.5 F (36.9 C) (Oral)  Resp 19  Ht 5\' 6"  (1.676 m)  Wt 210 lb (95.255 kg)  BMI 33.91 kg/m2  SpO2 96%  Physical Exam  Constitutional: He is oriented to person, place, and time. He appears well-developed and well-nourished. No distress.  HENT:  Head: Normocephalic and atraumatic.  Eyes: Conjunctivae and EOM are normal.  Neck: Normal range of motion.  Cardiovascular: Normal rate and regular rhythm.  Exam reveals no gallop and no friction rub.   No murmur heard. Pulmonary/Chest: Effort normal and breath sounds normal. He has no wheezes. He has no rales. He exhibits no tenderness.  Abdominal: Soft. There is no tenderness.  Musculoskeletal: Normal range of motion.  Splint on lower  right leg.   Neurological: He is alert and oriented to person, place, and time.  Speech is goal-oriented. Moves limbs without ataxia.   Skin: Skin is warm and dry.  Psychiatric: He has a normal mood and affect. His behavior is normal.  Nursing note and vitals reviewed.   ED Course  Procedures (including critical care time)  DIAGNOSTIC STUDIES: Oxygen Saturation is 96% on room air, adequate by my interpretation.    COORDINATION OF CARE: 11:12 AM-   Labs Review Labs Reviewed - No data to display  Imaging Review No results found.   EKG Interpretation None      MDM   Final diagnoses:  Follow up     12:26 PM Patient given resources for follow up. No further evaluation needed at this time.   I personally performed the services described in this documentation, which was scribed in my presence. The recorded information has been reviewed and is accurate.   Emilia Beck, PA-C 12/02/14 1230  Azalia Bilis, MD 12/02/14 1300

## 2014-12-03 ENCOUNTER — Encounter (HOSPITAL_COMMUNITY): Payer: Self-pay | Admitting: *Deleted

## 2014-12-03 MED ORDER — CEFAZOLIN SODIUM-DEXTROSE 2-3 GM-% IV SOLR
2.0000 g | INTRAVENOUS | Status: AC
  Administered 2014-12-04: 2 g via INTRAVENOUS
  Filled 2014-12-03: qty 50

## 2014-12-03 MED ORDER — CHLORHEXIDINE GLUCONATE 4 % EX LIQD
60.0000 mL | Freq: Once | CUTANEOUS | Status: DC
  Filled 2014-12-03: qty 60

## 2014-12-03 MED ORDER — LACTATED RINGERS IV SOLN
INTRAVENOUS | Status: DC
  Administered 2014-12-04 (×2): via INTRAVENOUS

## 2014-12-03 NOTE — Interval H&P Note (Signed)
History and Physical Interval Note:  Swelling has resolved enough in R ankle to allow for ORIF of R distal tibia  Plan for outpt procedure   12/03/2014 3:00 PM  [facility], with the diagnosis of RIGHT PILON FRACTURE  The various methods of treatment have been discussed with the patient and family. After consideration of risks, benefits and other options for treatment, the patient has consented to  Procedure(s) with comments: RIGHT OPEN REDUCTION INTERNAL FIXATION (ORIF) PILON FRACTURE (Right) - ANESTHESIA: GENERAL, REGIONAL BLOCK as a surgical intervention .  The patient's history has been reviewed, patient examined, no change in status, stable for surgery.  I have reviewed the patient's chart and labs.  Questions were answered to the patient's satisfaction.     [name] LatinKeith W. Celines Femia, PA-C [facility] [phone] (P) 12/03/2014 3:02 PM

## 2014-12-03 NOTE — Anesthesia Preprocedure Evaluation (Addendum)
Anesthesia Evaluation  Patient identified by MRN, date of birth, ID band Patient awake and Patient confused    Reviewed: Allergy & Precautions, NPO status , Patient's Chart, lab work & pertinent test results  Airway Mallampati: II   Neck ROM: Full    Dental  (+) Dental Advisory Given, Teeth Intact, Chipped   Pulmonary Current Smoker,  breath sounds clear to auscultation        Cardiovascular negative cardio ROS  Rhythm:Regular     Neuro/Psych    GI/Hepatic negative GI ROS, Neg liver ROS,   Endo/Other  negative endocrine ROS  Renal/GU negative Renal ROS     Musculoskeletal   Abdominal (+)  Abdomen: soft.    Peds  Hematology   Anesthesia Other Findings Top front chipped tooth  Reproductive/Obstetrics                            Anesthesia Physical Anesthesia Plan  ASA: II  Anesthesia Plan: General   Post-op Pain Management: MAC Combined w/ Regional for Post-op pain   Induction: Intravenous  Airway Management Planned: Oral ETT  Additional Equipment:   Intra-op Plan:   Post-operative Plan: Extubation in OR  Informed Consent: I have reviewed the patients History and Physical, chart, labs and discussed the procedure including the risks, benefits and alternatives for the proposed anesthesia with the patient or authorized representative who has indicated his/her understanding and acceptance.     Plan Discussed with:   Anesthesia Plan Comments: (mjltimodal pain RX,  Will offer block)        Anesthesia Quick Evaluation

## 2014-12-03 NOTE — Progress Notes (Signed)
No pre-op orders in EPIC. Called Dr. Magdalene PatriciaHandy's office and [facility] She states she will remind Dr. Carola FrostHandy.
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
## 2014-12-04 ENCOUNTER — Ambulatory Visit (HOSPITAL_COMMUNITY)
Admission: RE | Admit: 2014-12-04 | Discharge: 2014-12-04 | Disposition: A | Discharge status: Home / Self Care | Payer: Self-pay | Source: Ambulatory Visit | Attending: Orthopedic Surgery | Admitting: Orthopedic Surgery

## 2014-12-04 ENCOUNTER — Ambulatory Visit (HOSPITAL_COMMUNITY): Payer: Self-pay | Admitting: Anesthesiology

## 2014-12-04 ENCOUNTER — Ambulatory Visit (HOSPITAL_COMMUNITY): Payer: Self-pay

## 2014-12-04 ENCOUNTER — Encounter (HOSPITAL_COMMUNITY): Payer: Self-pay | Admitting: *Deleted

## 2014-12-04 ENCOUNTER — Encounter (HOSPITAL_COMMUNITY)
Admission: RE | Disposition: A | Discharge status: Home / Self Care | Payer: Self-pay | Source: Ambulatory Visit | Attending: Orthopedic Surgery

## 2014-12-04 DIAGNOSIS — W1830XA Fall on same level, unspecified, initial encounter: Secondary | ICD-10-CM | POA: Insufficient documentation

## 2014-12-04 DIAGNOSIS — S82871A Displaced pilon fracture of right tibia, initial encounter for closed fracture: Secondary | ICD-10-CM | POA: Insufficient documentation

## 2014-12-04 DIAGNOSIS — Z419 Encounter for procedure for purposes other than remedying health state, unspecified: Secondary | ICD-10-CM

## 2014-12-04 DIAGNOSIS — S82309A Unspecified fracture of lower end of unspecified tibia, initial encounter for closed fracture: Secondary | ICD-10-CM

## 2014-12-04 DIAGNOSIS — D649 Anemia, unspecified: Secondary | ICD-10-CM | POA: Insufficient documentation

## 2014-12-04 DIAGNOSIS — Z86718 Personal history of other venous thrombosis and embolism: Secondary | ICD-10-CM | POA: Insufficient documentation

## 2014-12-04 DIAGNOSIS — Z86711 Personal history of pulmonary embolism: Secondary | ICD-10-CM | POA: Insufficient documentation

## 2014-12-04 DIAGNOSIS — Y93E5 Activity, floor mopping and cleaning: Secondary | ICD-10-CM | POA: Insufficient documentation

## 2014-12-04 HISTORY — PX: ORIF ANKLE FRACTURE: SHX5408

## 2014-12-04 LAB — CBC WITH DIFFERENTIAL/PLATELET
BASOS ABS: 0 10*3/uL (ref 0.0–0.1)
BASOS PCT: 0 % (ref 0–1)
EOS PCT: 6 % — AB (ref 0–5)
Eosinophils Absolute: 0.4 10*3/uL (ref 0.0–0.7)
HEMATOCRIT: 41 % (ref 39.0–52.0)
Hemoglobin: 13.7 g/dL (ref 13.0–17.0)
Lymphocytes Relative: 28 % (ref 12–46)
Lymphs Abs: 1.7 10*3/uL (ref 0.7–4.0)
MCH: 29.1 pg (ref 26.0–34.0)
MCHC: 33.4 g/dL (ref 30.0–36.0)
MCV: 87.2 fL (ref 78.0–100.0)
MONO ABS: 0.6 10*3/uL (ref 0.1–1.0)
Monocytes Relative: 10 % (ref 3–12)
Neutro Abs: 3.4 10*3/uL (ref 1.7–7.7)
Neutrophils Relative %: 56 % (ref 43–77)
PLATELETS: 279 10*3/uL (ref 150–400)
RBC: 4.7 MIL/uL (ref 4.22–5.81)
RDW: 12.7 % (ref 11.5–15.5)
WBC: 6.1 10*3/uL (ref 4.0–10.5)

## 2014-12-04 LAB — PROTIME-INR
INR: 1.02 (ref 0.00–1.49)
Prothrombin Time: 13.5 seconds (ref 11.6–15.2)

## 2014-12-04 SURGERY — OPEN REDUCTION INTERNAL FIXATION (ORIF) ANKLE FRACTURE
Anesthesia: General | Site: Ankle | Laterality: Right

## 2014-12-04 MED ORDER — SODIUM CHLORIDE 0.9 % IJ SOLN
INTRAMUSCULAR | Status: AC
  Filled 2014-12-04: qty 10

## 2014-12-04 MED ORDER — GLYCOPYRROLATE 0.2 MG/ML IJ SOLN
INTRAMUSCULAR | Status: DC | PRN
  Administered 2014-12-04: .8 mg via INTRAVENOUS

## 2014-12-04 MED ORDER — ROCURONIUM BROMIDE 50 MG/5ML IV SOLN
INTRAVENOUS | Status: AC
  Filled 2014-12-04: qty 1

## 2014-12-04 MED ORDER — METHOCARBAMOL 500 MG PO TABS: 500.0000 mg | ORAL_TABLET | Freq: Four times a day (QID) | ORAL | Status: DC | PRN

## 2014-12-04 MED ORDER — FENTANYL CITRATE 0.05 MG/ML IJ SOLN
INTRAMUSCULAR | Status: DC | PRN
  Administered 2014-12-04: 50 ug via INTRAVENOUS
  Administered 2014-12-04: 100 ug via INTRAVENOUS
  Administered 2014-12-04 (×2): 50 ug via INTRAVENOUS

## 2014-12-04 MED ORDER — OXYCODONE-ACETAMINOPHEN 5-325 MG PO TABS: 1.0000 | ORAL_TABLET | Freq: Four times a day (QID) | ORAL | Status: DC | PRN

## 2014-12-04 MED ORDER — FENTANYL CITRATE 0.05 MG/ML IJ SOLN
25.0000 ug | INTRAMUSCULAR | Status: DC | PRN
  Administered 2014-12-04 (×3): 50 ug via INTRAVENOUS

## 2014-12-04 MED ORDER — PROPOFOL 10 MG/ML IV BOLUS
INTRAVENOUS | Status: DC | PRN
  Administered 2014-12-04: 170 mg via INTRAVENOUS

## 2014-12-04 MED ORDER — FENTANYL CITRATE 0.05 MG/ML IJ SOLN
INTRAMUSCULAR | Status: AC
  Filled 2014-12-04: qty 2

## 2014-12-04 MED ORDER — GLYCOPYRROLATE 0.2 MG/ML IJ SOLN
INTRAMUSCULAR | Status: AC
  Filled 2014-12-04: qty 4

## 2014-12-04 MED ORDER — LACTATED RINGERS IV SOLN
INTRAVENOUS | Status: DC | PRN
  Administered 2014-12-04: 11:00:00 via INTRAVENOUS

## 2014-12-04 MED ORDER — PROPOFOL 10 MG/ML IV BOLUS
INTRAVENOUS | Status: AC
  Filled 2014-12-04: qty 20

## 2014-12-04 MED ORDER — NEOSTIGMINE METHYLSULFATE 10 MG/10ML IV SOLN
INTRAVENOUS | Status: DC | PRN
  Administered 2014-12-04: 4 mg via INTRAVENOUS

## 2014-12-04 MED ORDER — MIDAZOLAM HCL 2 MG/2ML IJ SOLN
INTRAMUSCULAR | Status: AC
  Filled 2014-12-04: qty 2

## 2014-12-04 MED ORDER — BUPIVACAINE-EPINEPHRINE (PF) 0.5% -1:200000 IJ SOLN
INTRAMUSCULAR | Status: DC | PRN
  Administered 2014-12-04: 30 mL via PERINEURAL

## 2014-12-04 MED ORDER — NEOSTIGMINE METHYLSULFATE 10 MG/10ML IV SOLN
INTRAVENOUS | Status: AC
  Filled 2014-12-04: qty 1

## 2014-12-04 MED ORDER — ROCURONIUM BROMIDE 100 MG/10ML IV SOLN
INTRAVENOUS | Status: DC | PRN
  Administered 2014-12-04 (×2): 10 mg via INTRAVENOUS
  Administered 2014-12-04: 40 mg via INTRAVENOUS
  Administered 2014-12-04: 30 mg via INTRAVENOUS
  Administered 2014-12-04: 10 mg via INTRAVENOUS

## 2014-12-04 MED ORDER — LIDOCAINE HCL (CARDIAC) 20 MG/ML IV SOLN
INTRAVENOUS | Status: DC | PRN
  Administered 2014-12-04: 100 mg via INTRAVENOUS

## 2014-12-04 MED ORDER — FENTANYL CITRATE 0.05 MG/ML IJ SOLN
100.0000 ug | Freq: Once | INTRAMUSCULAR | Status: AC
  Administered 2014-12-04: 100 ug via INTRAVENOUS

## 2014-12-04 MED ORDER — MIDAZOLAM HCL 2 MG/2ML IJ SOLN
INTRAMUSCULAR | Status: AC
  Administered 2014-12-04: 1 mg
  Filled 2014-12-04: qty 2

## 2014-12-04 MED ORDER — PHENYLEPHRINE HCL 10 MG/ML IJ SOLN
INTRAMUSCULAR | Status: AC
  Filled 2014-12-04: qty 1

## 2014-12-04 MED ORDER — OXYCODONE HCL 5 MG PO TABS: 5.0000 mg | ORAL_TABLET | Freq: Four times a day (QID) | ORAL | Status: DC | PRN

## 2014-12-04 MED ORDER — ONDANSETRON HCL 4 MG/2ML IJ SOLN
INTRAMUSCULAR | Status: DC | PRN
  Administered 2014-12-04: 4 mg via INTRAVENOUS

## 2014-12-04 MED ORDER — PROMETHAZINE HCL 12.5 MG PO TABS: 12.5000 mg | ORAL_TABLET | Freq: Four times a day (QID) | ORAL | Status: DC | PRN

## 2014-12-04 MED ORDER — ONDANSETRON HCL 4 MG/2ML IJ SOLN
INTRAMUSCULAR | Status: AC
  Filled 2014-12-04: qty 2

## 2014-12-04 MED ORDER — ROCURONIUM BROMIDE 50 MG/5ML IV SOLN
INTRAVENOUS | Status: AC
Start: 2014-12-04 — End: 2014-12-04
  Filled 2014-12-04: qty 1

## 2014-12-04 MED ORDER — NEOSTIGMINE METHYLSULFATE 10 MG/10ML IV SOLN
INTRAVENOUS | Status: AC
  Filled 2014-12-04: qty 2

## 2014-12-04 MED ORDER — LIDOCAINE HCL (CARDIAC) 20 MG/ML IV SOLN
INTRAVENOUS | Status: AC
  Filled 2014-12-04: qty 10

## 2014-12-04 MED ORDER — MEPERIDINE HCL 25 MG/ML IJ SOLN: 6.2500 mg | INTRAMUSCULAR | Status: DC | PRN

## 2014-12-04 MED ORDER — LACTATED RINGERS IV SOLN
Freq: Once | INTRAVENOUS | Status: AC
  Administered 2014-12-04: 09:00:00 via INTRAVENOUS

## 2014-12-04 MED ORDER — PROMETHAZINE HCL 25 MG/ML IJ SOLN: 6.2500 mg | INTRAMUSCULAR | Status: DC | PRN

## 2014-12-04 MED ORDER — MIDAZOLAM HCL 5 MG/ML IJ SOLN: 1.0000 mg | Freq: Once | INTRAMUSCULAR | Status: AC

## 2014-12-04 MED ORDER — FENTANYL CITRATE 0.05 MG/ML IJ SOLN
INTRAMUSCULAR | Status: AC
  Filled 2014-12-04: qty 5

## 2014-12-04 MED ORDER — FENTANYL CITRATE 0.05 MG/ML IJ SOLN
INTRAMUSCULAR | Status: AC
  Administered 2014-12-04: 100 ug via INTRAVENOUS
  Filled 2014-12-04: qty 2

## 2014-12-04 MED ORDER — 0.9 % SODIUM CHLORIDE (POUR BTL) OPTIME
TOPICAL | Status: DC | PRN
  Administered 2014-12-04: 1000 mL

## 2014-12-04 SURGICAL SUPPLY — 78 items
BANDAGE ELASTIC 4 VELCRO ST LF (GAUZE/BANDAGES/DRESSINGS) ×3 IMPLANT
BANDAGE ELASTIC 6 VELCRO ST LF (GAUZE/BANDAGES/DRESSINGS) ×3 IMPLANT
BANDAGE ESMARK 6X9 LF (GAUZE/BANDAGES/DRESSINGS) ×1 IMPLANT
BIT DRILL 2.5X125 (BIT) ×3 IMPLANT
BIT DRILL 3.1X204 (BIT) ×3 IMPLANT
BIT DRILL TWST MATTA 3.5MX195M (BIT) ×1 IMPLANT
BLADE SURG 10 STRL SS (BLADE) ×3 IMPLANT
BNDG COHESIVE 4X5 TAN STRL (GAUZE/BANDAGES/DRESSINGS) ×3 IMPLANT
BNDG ESMARK 6X9 LF (GAUZE/BANDAGES/DRESSINGS) ×3
BNDG GAUZE ELAST 4 BULKY (GAUZE/BANDAGES/DRESSINGS) ×3 IMPLANT
BRUSH SCRUB DISP (MISCELLANEOUS) ×6 IMPLANT
COVER SURGICAL LIGHT HANDLE (MISCELLANEOUS) ×3 IMPLANT
DRAPE C-ARM 42X72 X-RAY (DRAPES) IMPLANT
DRAPE C-ARMOR (DRAPES) ×3 IMPLANT
DRAPE ORTHO SPLIT 77X108 STRL (DRAPES)
DRAPE PROXIMA HALF (DRAPES) ×3 IMPLANT
DRAPE SURG ORHT 6 SPLT 77X108 (DRAPES) IMPLANT
DRAPE U-SHAPE 47X51 STRL (DRAPES) ×3 IMPLANT
DRILL TWIST AO MATTA 3.5MX195M (BIT) ×3
DRSG EMULSION OIL 3X3 NADH (GAUZE/BANDAGES/DRESSINGS) IMPLANT
DRSG MEPITEL 4X7.2 (GAUZE/BANDAGES/DRESSINGS) ×3 IMPLANT
ELECT REM PT RETURN 9FT ADLT (ELECTROSURGICAL) ×3
ELECTRODE REM PT RTRN 9FT ADLT (ELECTROSURGICAL) ×1 IMPLANT
GAUZE SPONGE 4X4 12PLY STRL (GAUZE/BANDAGES/DRESSINGS) ×3 IMPLANT
GLOVE BIO SURGEON STRL SZ7.5 (GLOVE) ×3 IMPLANT
GLOVE BIO SURGEON STRL SZ8 (GLOVE) ×3 IMPLANT
GLOVE BIOGEL PI IND STRL 7.5 (GLOVE) ×1 IMPLANT
GLOVE BIOGEL PI IND STRL 8 (GLOVE) ×1 IMPLANT
GLOVE BIOGEL PI INDICATOR 7.5 (GLOVE) ×2
GLOVE BIOGEL PI INDICATOR 8 (GLOVE) ×2
GOWN STRL REUS W/ TWL LRG LVL3 (GOWN DISPOSABLE) ×2 IMPLANT
GOWN STRL REUS W/ TWL XL LVL3 (GOWN DISPOSABLE) ×1 IMPLANT
GOWN STRL REUS W/TWL LRG LVL3 (GOWN DISPOSABLE) ×4
GOWN STRL REUS W/TWL XL LVL3 (GOWN DISPOSABLE) ×2
KIT BASIN OR (CUSTOM PROCEDURE TRAY) ×3 IMPLANT
KIT ROOM TURNOVER OR (KITS) ×3 IMPLANT
MANIFOLD NEPTUNE II (INSTRUMENTS) IMPLANT
NEEDLE HYPO 21X1.5 SAFETY (NEEDLE) IMPLANT
NS IRRIG 1000ML POUR BTL (IV SOLUTION) ×3 IMPLANT
PACK GENERAL/GYN (CUSTOM PROCEDURE TRAY) ×3 IMPLANT
PAD ARMBOARD 7.5X6 YLW CONV (MISCELLANEOUS) ×6 IMPLANT
PAD CAST 4YDX4 CTTN HI CHSV (CAST SUPPLIES) ×1 IMPLANT
PADDING CAST ABS 6INX4YD NS (CAST SUPPLIES) ×2
PADDING CAST ABS COTTON 6X4 NS (CAST SUPPLIES) ×1 IMPLANT
PADDING CAST COTTON 4X4 STRL (CAST SUPPLIES) ×2
PADDING CAST COTTON 6X4 STRL (CAST SUPPLIES) IMPLANT
PENCIL BUTTON HOLSTER BLD 10FT (ELECTRODE) IMPLANT
PLATE ANT LAT TIBIA RT (Plate) ×3 IMPLANT
PLATE TUBULAR 1/3 2H (Plate) ×3 IMPLANT
PLATE TUBULAR 1/3 2H 25MM (Plate) ×1 IMPLANT
SCREW 3.5X46MM (Screw) ×6 IMPLANT
SCREW CANCELLOUS 4.0X55MM (Screw) ×3 IMPLANT
SCREW CORT 2.5XFT 44X3.5XST (Screw) ×1 IMPLANT
SCREW CORT 48X3.5XST NS LF (Screw) ×1 IMPLANT
SCREW CORTEX ST MATTA 3.5X30MM (Screw) ×3 IMPLANT
SCREW CORTEX ST MATTA 3.5X32MM (Screw) ×9 IMPLANT
SCREW CORTEX ST MATTA 3.5X50MM (Screw) ×6 IMPLANT
SCREW CORTICAL 3.5X44MM (Screw) ×2 IMPLANT
SCREW CORTICAL 3.5X48MM (Screw) ×2 IMPLANT
SCREW LOCKING 4.0X46MM (Screw) ×6 IMPLANT
SCREW LOCKING 44MM (Screw) ×3 IMPLANT
SPONGE LAP 18X18 X RAY DECT (DISPOSABLE) ×3 IMPLANT
SPONGE SCRUB IODOPHOR (GAUZE/BANDAGES/DRESSINGS) ×3 IMPLANT
STAPLER VISISTAT 35W (STAPLE) IMPLANT
SUCTION FRAZIER TIP 10 FR DISP (SUCTIONS) ×3 IMPLANT
SUT ETHILON 2 0 FS 18 (SUTURE) IMPLANT
SUT ETHILON 3 0 PS 1 (SUTURE) ×9 IMPLANT
SUT PDS AB 2-0 CT1 27 (SUTURE) IMPLANT
SUT VIC AB 2-0 CT1 27 (SUTURE) ×4
SUT VIC AB 2-0 CT1 TAPERPNT 27 (SUTURE) ×2 IMPLANT
SUT VIC AB 2-0 CT3 27 (SUTURE) IMPLANT
SYR CONTROL 10ML LL (SYRINGE) IMPLANT
TOWEL OR 17X24 6PK STRL BLUE (TOWEL DISPOSABLE) ×3 IMPLANT
TOWEL OR 17X26 10 PK STRL BLUE (TOWEL DISPOSABLE) ×6 IMPLANT
TUBE CONNECTING 12'X1/4 (SUCTIONS) ×1
TUBE CONNECTING 12X1/4 (SUCTIONS) ×2 IMPLANT
UNDERPAD 30X30 INCONTINENT (UNDERPADS AND DIAPERS) ×3 IMPLANT
WATER STERILE IRR 1000ML POUR (IV SOLUTION) IMPLANT

## 2014-12-04 NOTE — Brief Op Note (Signed)
12/04/2014  1:35 PM  PATIENT:  [name] Hart  29 y.o. male  PRE-OPERATIVE DIAGNOSIS:   1. RIGHT TIBIAL PILON FRACTURE  POST-OPERATIVE DIAGNOSIS:   1. RIGHT TIBIAL PILON FRACTURE 2. SYNDESMOTIC DISRUPTION  PROCEDURE:  Procedure(s) with comments: 1. RIGHT OPEN REDUCTION INTERNAL FIXATION (ORIF) PILON FRACTURE (Right) 2. REPAIR OF SYNDESMOSIS 3. STRESS FLOUROSCOPY OF THE ANKLE SYNDESMOSIS  ANESTHESIA: GENERAL, REGIONAL BLOCK  SURGEON:  Surgeon(s) and Role:    * Myrene GalasMichael Edilia Ghuman, MD - Primary  PHYSICIAN ASSISTANT: Montez MoritaKeith Paul, PA-C  ANESTHESIA:   general  I/O:  Total I/O In: 1000 [I.V.:1000] Out: -   SPECIMEN:  No Specimen  TOURNIQUET:    DICTATION: .Other Dictation: Dictation Number [id number]

## 2014-12-04 NOTE — Progress Notes (Signed)
I have seen and examined the patient. I agree with Mr. Ollen Grossaul's findings above.  I discussed with the patient the risks and benefits of surgery for right pilon, including the possibility of infection, nerve injury, vessel injury, wound breakdown, arthritis, symptomatic hardware, DVT/ PE, loss of motion, and need for further surgery among others.  He understood these risks and wished to proceed.   Budd PalmerHANDY,Heylee Tant H, MD 12/04/2014 10:34 AM

## 2014-12-04 NOTE — Transfer of Care (Signed)
Immediate Anesthesia Transfer of Care Note  Patient: [name] Hart  Procedure(s) Performed: Procedure(s) with comments: RIGHT OPEN REDUCTION INTERNAL FIXATION (ORIF) PILON FRACTURE (Right) - ANESTHESIA: GENERAL, REGIONAL BLOCK  Patient Location: PACU  Anesthesia Type:General  Level of Consciousness: awake, alert  and oriented  Airway & Oxygen Therapy: Patient Spontanous Breathing and Patient connected to nasal cannula oxygen  Post-op Assessment: Report given to RN and Post -op Vital signs reviewed and stable  Post vital signs: Reviewed and stable  Last Vitals:  Filed Vitals:   12/04/14 1040  BP:   Pulse: 60  Temp:   Resp: 16    Complications: No apparent anesthesia complications

## 2014-12-04 NOTE — Anesthesia Procedure Notes (Addendum)
Anesthesia Regional Block:  Popliteal block  Pre-Anesthetic Checklist: ,, timeout performed, Correct Patient, Correct Site, Correct Laterality, Correct Procedure, Correct Position, site marked, Risks and benefits discussed,  Surgical consent,  Pre-op evaluation,  At surgeon's request and post-op pain management  Laterality: Lower and Right  Prep: Maximum Sterile Barrier Precautions used and chloraprep       Needles:  Injection technique: Single-shot  Needle Type: Echogenic Stimulator Needle     Needle Length: 10cm 10 cm Needle Gauge: 21 and 21 G    Additional Needles:  Procedures: ultrasound guided (picture in chart) and nerve stimulator Popliteal block  Nerve Stimulator or Paresthesia:  Response: 0.4 mA,   Additional Responses:   Narrative:  Start time: 12/04/2014 9:44 AM End time: 12/04/2014 9:58 AM Injection made incrementally with aspirations every 5 mL. Anesthesiologist: Sebastian AcheMANNY, THEODORE  Additional Notes: R popliteal block 30ml .5% marcaine with epi, multiple asp, talked to patient throughout, no complications   Procedure Name: Intubation Date/Time: 12/04/2014 11:16 AM Performed by: CHS IncSTERLING, [name] Pre-anesthesia Checklist: Patient identified, Timeout performed, Emergency Drugs available and Suction available Patient Re-evaluated:Patient Re-evaluated prior to inductionOxygen Delivery Method: Circle system utilized Preoxygenation: Pre-oxygenation with 100% oxygen Intubation Type: IV induction Ventilation: Mask ventilation without difficulty Laryngoscope Size: Mac and 4 Grade View: Grade I Tube type: Oral Tube size: 7.0 mm Number of attempts: 1 Placement Confirmation: ETT inserted through vocal cords under direct vision and positive ETCO2 Secured at: 22 cm Dental Injury: Teeth and Oropharynx as per pre-operative assessment

## 2014-12-04 NOTE — Anesthesia Postprocedure Evaluation (Signed)
  Anesthesia Post-op Note  Patient: [name] Hart  Procedure(s) Performed: Procedure(s) with comments: RIGHT OPEN REDUCTION INTERNAL FIXATION (ORIF) PILON FRACTURE (Right) - ANESTHESIA: GENERAL, REGIONAL BLOCK  Patient Location: PACU  Anesthesia Type:General  Level of Consciousness: awake and alert   Airway and Oxygen Therapy: Patient Spontanous Breathing and Patient connected to nasal cannula oxygen  Post-op Pain: mild  Post-op Assessment: Post-op Vital signs reviewed and Patient's Cardiovascular Status Stable  Post-op Vital Signs: Reviewed and stable  Last Vitals:  Filed Vitals:   12/04/14 1445  BP:   Pulse: 61  Temp:   Resp: 8    Complications: No apparent anesthesia complications

## 2014-12-04 NOTE — Discharge Instructions (Signed)
[facility] Discharge Instructions   General Discharge Instructions  WEIGHT BEARING STATUS: Nonweightbearing Right Leg   RANGE OF MOTION/ACTIVITY: no range of motion of ankle, ok to move knee. DO NOT REMOVE SPLINT   Diet: as you were eating previously.  Can use over the counter stool softeners and bowel preparations, such as Miralax, to help with bowel movements.  Narcotics can be constipating.  Be sure to drink plenty of fluids  STOP SMOKING OR USING NICOTINE PRODUCTS!!!!  As discussed nicotine severely impairs your body's ability to heal surgical and traumatic wounds but also impairs bone healing.  Wounds and bone heal by forming microscopic blood vessels (angiogenesis) and nicotine is a vasoconstrictor (essentially, shrinks blood vessels).  Therefore, if vasoconstriction occurs to these microscopic blood vessels they essentially disappear and are unable to deliver necessary nutrients to the healing tissue.  This is one modifiable factor that you can do to dramatically increase your chances of healing your injury.    (This means no smoking, no nicotine gum, patches, etc)  DO NOT USE NONSTEROIDAL ANTI-INFLAMMATORY DRUGS (NSAID'S)  Using products such as Advil (ibuprofen), Aleve (naproxen), Motrin (ibuprofen) for additional pain control during fracture healing can delay and/or prevent the healing response.  If you would like to take over the counter (OTC) medication, Tylenol (acetaminophen) is ok.  However, some narcotic medications that are given for pain control contain acetaminophen as well. Therefore, you should not exceed more than 4000 mg of tylenol in a day if you do not have liver disease.  Also note that there are may OTC medicines, such as cold medicines and allergy medicines that my contain tylenol as well.  If you have any questions about medications and/or interactions please ask your doctor/PA or your pharmacist.   PAIN MEDICATION USE AND EXPECTATIONS  You have likely  been given narcotic medications to help control your pain.  After a traumatic event that results in an fracture (broken bone) with or without surgery, it is ok to use narcotic pain medications to help control one's pain.  We understand that everyone responds to pain differently and each individual patient will be evaluated on a regular basis for the continued need for narcotic medications. Ideally, narcotic medication use should last no more than 6-8 weeks (coinciding with fracture healing).   As a patient it is your responsibility as well to monitor narcotic medication use and report the amount and frequency you use these medications when you come to your office visit.   We would also advise that if you are using narcotic medications, you should take a dose prior to therapy to maximize you participation.  IF YOU ARE ON NARCOTIC MEDICATIONS IT IS NOT PERMISSIBLE TO OPERATE A MOTOR VEHICLE (MOTORCYCLE/CAR/TRUCK/MOPED) OR HEAVY MACHINERY DO NOT MIX NARCOTICS WITH OTHER CNS (CENTRAL NERVOUS SYSTEM) DEPRESSANTS SUCH AS ALCOHOL       ICE AND ELEVATE INJURED/OPERATIVE EXTREMITY  Using ice and elevating the injured extremity above your heart can help with swelling and pain control.  Icing in a pulsatile fashion, such as 20 minutes on and 20 minutes off, can be followed.    Do not place ice directly on skin. Make sure there is a barrier between to skin and the ice pack.    Using frozen items such as frozen peas works well as the conform nicely to the are that needs to be iced.  USE AN ACE WRAP OR TED HOSE FOR SWELLING CONTROL  In addition to icing and elevation, Ace wraps  or TED hose are used to help limit and resolve swelling.  It is recommended to use Ace wraps or TED hose until you are informed to stop.    When using Ace Wraps start the wrapping distally (farthest away from the body) and wrap proximally (closer to the body)   Example: If you had surgery on your leg or thing and you do not have a splint  on, start the ace wrap at the toes and work your way up to the thigh        If you had surgery on your upper extremity and do not have a splint on, start the ace wrap at your fingers and work your way up to the upper arm  IF YOU ARE IN A SPLINT OR CAST DO NOT REMOVE IT FOR ANY REASON   If your splint gets wet for any reason please contact the office immediately. You may shower in your splint or cast as long as you keep it dry.  This can be done by wrapping in a cast cover or garbage back (or similar)  Do Not stick any thing down your splint or cast such as pencils, money, or hangers to try and scratch yourself with.  If you feel itchy take benadryl as prescribed on the bottle for itching  IF YOU ARE IN A CAM BOOT (BLACK BOOT)  You may remove boot periodically. Perform daily dressing changes as noted below.  Wash the liner of the boot regularly and wear a sock when wearing the boot. It is recommended that you sleep in the boot until told otherwise  CALL THE OFFICE WITH ANY QUESTIONS OR CONCERTS: [phone]
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
## 2014-12-05 ENCOUNTER — Emergency Department (HOSPITAL_COMMUNITY)
Admission: EM | Admit: 2014-12-05 | Discharge: 2014-12-05 | Disposition: A | Discharge status: Home / Self Care | Payer: Self-pay | Attending: Emergency Medicine | Admitting: Emergency Medicine

## 2014-12-05 ENCOUNTER — Encounter (HOSPITAL_COMMUNITY): Payer: Self-pay

## 2014-12-05 DIAGNOSIS — Z79899 Other long term (current) drug therapy: Secondary | ICD-10-CM | POA: Insufficient documentation

## 2014-12-05 DIAGNOSIS — Z9889 Other specified postprocedural states: Secondary | ICD-10-CM | POA: Insufficient documentation

## 2014-12-05 DIAGNOSIS — G8918 Other acute postprocedural pain: Secondary | ICD-10-CM | POA: Insufficient documentation

## 2014-12-05 DIAGNOSIS — Z8781 Personal history of (healed) traumatic fracture: Secondary | ICD-10-CM | POA: Insufficient documentation

## 2014-12-05 DIAGNOSIS — Z7982 Long term (current) use of aspirin: Secondary | ICD-10-CM | POA: Insufficient documentation

## 2014-12-05 DIAGNOSIS — Z720 Tobacco use: Secondary | ICD-10-CM | POA: Insufficient documentation

## 2014-12-05 LAB — VITAMIN D 25 HYDROXY (VIT D DEFICIENCY, FRACTURES): Vit D, 25-Hydroxy: 7.8 ng/mL — ABNORMAL LOW (ref 30.0–100.0)

## 2014-12-05 MED ORDER — HYDROMORPHONE HCL 1 MG/ML IJ SOLN
1.0000 mg | Freq: Once | INTRAMUSCULAR | Status: AC
  Administered 2014-12-05: 1 mg via INTRAVENOUS
  Filled 2014-12-05: qty 1

## 2014-12-05 MED ORDER — HYDROMORPHONE HCL 1 MG/ML IJ SOLN
0.5000 mg | Freq: Once | INTRAMUSCULAR | Status: AC
  Administered 2014-12-05: 0.5 mg via INTRAVENOUS
  Filled 2014-12-05: qty 1
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
## 2014-12-05 NOTE — Discharge Instructions (Signed)
Pain Relief Preoperatively and Postoperatively °Being a good patient does not mean being a silent one. If you have questions, problems, or concerns about the pain you may feel after surgery, let your caregiver know. Patients have the right to assessment and management of pain. The treatment of pain after surgery is important to speed up recovery and return to normal activities. Severe pain after surgery, and the fear or anxiety associated with that pain, may cause extreme discomfort that: °· Prevents sleep. °· Decreases the ability to breathe deeply and cough. This can cause pneumonia or other upper airway infections. °· Causes your heart to beat faster and your blood pressure to be higher. °· Increases the risk for constipation and bloating. °· Decreases the ability of wounds to heal. °· May result in depression, increased anxiety, and feelings of helplessness. °Relief of pain before surgery is also important because it will lessen the pain after surgery. Patients who receive both pain relief before and after surgery experience greater pain relief than those who only receive pain relief after surgery. Let your caregiver know if you are having uncontrolled pain. This is very important. Pain after surgery is more difficult to manage if it is permitted to become severe, so prompt and adequate treatment of acute pain is necessary. °PAIN CONTROL METHODS °Your caregivers follow policies and procedures about the management of patient pain. These guidelines should be explained to you before surgery. Plans for pain control after surgery must be mutually decided upon and instituted with your full understanding and agreement. Do not be afraid to ask questions regarding the care you are receiving. There are many different ways your caregivers will attempt to control your pain, including the following methods. °As needed pain control °· You may be given pain medicine either through your intravenous (IV) tube, or as a pill or  liquid you can swallow. You will need to let your caregiver know when you are having pain. Then, your caregiver will give you the pain medicine ordered for you. °· Your pain medicine may make you constipated. If constipation occurs, drink more liquids if you can. Your caregiver may have you take a mild laxative. °IV patient-controlled analgesia pump (PCA pump) °· You can get your pain medicine through the IV tube which goes into your vein. You are able to control the amount of pain medicine that you get. The pain medicine flows in through an IV tube and is controlled by a pump. This pump gives you a set amount of pain medicine when you push the button hooked up to it. Nobody should push this button but you or someone specifically assigned by you to do so. It is set up to keep you from accidentally giving yourself too much pain medicine. You will be able to start using your pain pump in the recovery room after your surgery. This method can be helpful for most types of surgery. °· If you are still having too much pain, tell your caregiver. Also, tell your caregiver if you are feeling too sleepy or nauseous. °Continuous epidural pain control °· A thin, soft tube (catheter) is put into your back. Pain medicine flows through the catheter to lessen pain in the part of your body where the surgery is done. Continuous epidural pain control may work best for you if you are having surgery on your chest, abdomen, hip area, or legs. The epidural catheter is usually put into your back just before surgery. The catheter is left in until you can eat and take medicine by mouth. In most cases,   this may take 2 to 3 days.  Giving pain medicine through the epidural catheter may help you heal faster because:  Your bowel gets back to normal faster.  You can get back to eating sooner.  You can be up and walking sooner. Medicine that numbs the area (local anesthetic)  You may receive an injection of pain medicine near where the  pain is (local infiltration).  You may receive an injection of pain medicine near the nerve that controls the sensation to a specific part of the body (peripheral nerve block).  Medicine may be put in the spine to block pain (spinal block). Opioids  Moderate to moderately severe acute pain after surgery may respond to opioids.Opioids are narcotic pain medicine. Opioids are often combined with non-narcotic medicines to improve pain relief, diminish the risk of side effects, and reduce the chance of addiction.  If you follow your caregiver's directions about taking opioids and you do not have a history of substance abuse, your risk of becoming addicted is exceptionally small.Opioids are given for short periods of time in careful doses to prevent addiction. Other methods of pain control include:  Steroids.  Physical therapy.  Heat and cold therapy.  Compression, such as wrapping an elastic bandage around the area of pain.  Massage. These various ways of controlling pain may be used together. Combining different methods of pain control is called multimodal analgesia. Using this approach has many benefits, including being able to eat, move around, and leave the hospital sooner. Document Released: 11/19/2002 Document Revised: 11/21/2011 Document Reviewed: 11/23/2010 [facility] Patient Information 2015 [place], [place]. This information is not intended to replace advice given to you by your health care provider. Make sure you discuss any questions you have with your health care provider. Cast or Splint Care Casts and splints support injured limbs and keep bones from moving while they heal. It is important to care for your cast or splint at home.  HOME CARE INSTRUCTIONS Keep the cast or splint uncovered during the drying period. It can take 24 to 48 hours to dry if it is made of plaster. A fiberglass cast will dry in less than 1 hour. Do not rest the cast on anything harder than a pillow for the  first 24 hours. Do not put weight on your injured limb or apply pressure to the cast until your health care provider gives you permission. Keep the cast or splint dry. Wet casts or splints can lose their shape and may not support the limb as well. A wet cast that has lost its shape can also create harmful pressure on your skin when it dries. Also, wet skin can become infected. Cover the cast or splint with a plastic bag when bathing or when out in the rain or snow. If the cast is on the trunk of the body, take sponge baths until the cast is removed. If your cast does become wet, dry it with a towel or a blow dryer on the cool setting only. Keep your cast or splint clean. Soiled casts may be wiped with a moistened cloth. Do not place any hard or soft foreign objects under your cast or splint, such as cotton, toilet paper, lotion, or powder. Do not try to scratch the skin under the cast with any object. The object could get stuck inside the cast. Also, scratching could lead to an infection. If itching is a problem, use a blow dryer on a cool setting to relieve discomfort. Do not trim or  cut your cast or remove padding from inside of it. Exercise all joints next to the injury that are not immobilized by the cast or splint. For example, if you have a long leg cast, exercise the hip joint and toes. If you have an arm cast or splint, exercise the shoulder, elbow, thumb, and fingers. Elevate your injured arm or leg on 1 or 2 pillows for the first 1 to 3 days to decrease swelling and pain.It is best if you can comfortably elevate your cast so it is higher than your heart. SEEK MEDICAL CARE IF:  Your cast or splint cracks. Your cast or splint is too tight or too loose. You have unbearable itching inside the cast. Your cast becomes wet or develops a soft spot or area. You have a bad smell coming from inside your cast. You get an object stuck under your cast. Your skin around the cast becomes red or  raw. You have new pain or worsening pain after the cast has been applied. SEEK IMMEDIATE MEDICAL CARE IF:  You have fluid leaking through the cast. You are unable to move your fingers or toes. You have discolored (blue or white), cool, painful, or very swollen fingers or toes beyond the cast. You have tingling or numbness around the injured area. You have severe pain or pressure under the cast. You have any difficulty with your breathing or have shortness of breath. You have chest pain. Document Released: 08/26/2000 Document Revised: 06/19/2013 Document Reviewed: 03/07/2013 [facility] Patient Information 2015 [place]. This information is not intended to replace advice given to you by your health care provider. Make sure you discuss any questions you have with your health care provider.

## 2014-12-09 ENCOUNTER — Encounter (HOSPITAL_COMMUNITY): Payer: Self-pay | Admitting: Orthopedic Surgery

## 2014-12-12 ENCOUNTER — Encounter (HOSPITAL_COMMUNITY): Payer: Self-pay | Admitting: Orthopedic Surgery

## 2014-12-12 LAB — VITAMIN D 1,25 DIHYDROXY
Vitamin D 1, 25 (OH)2 Total: 35 pg/mL
Vitamin D2 1, 25 (OH)2: <10 pg/mL
Vitamin D3 1, 25 (OH)2: 34 pg/mL

## 2014-12-29 ENCOUNTER — Encounter (HOSPITAL_COMMUNITY): Payer: Self-pay | Admitting: Orthopedic Surgery

## 2015-04-21 ENCOUNTER — Encounter (HOSPITAL_COMMUNITY): Payer: Self-pay

## 2015-04-21 ENCOUNTER — Encounter (HOSPITAL_COMMUNITY)
Admission: RE | Admit: 2015-04-21 | Discharge: 2015-04-21 | Disposition: A | Discharge status: Home / Self Care | Payer: Self-pay | Source: Ambulatory Visit | Attending: Orthopedic Surgery | Admitting: Orthopedic Surgery

## 2015-04-21 DIAGNOSIS — Z01818 Encounter for other preprocedural examination: Secondary | ICD-10-CM | POA: Insufficient documentation

## 2015-04-21 DIAGNOSIS — Z01812 Encounter for preprocedural laboratory examination: Secondary | ICD-10-CM | POA: Insufficient documentation

## 2015-04-21 DIAGNOSIS — R001 Bradycardia, unspecified: Secondary | ICD-10-CM | POA: Insufficient documentation

## 2015-04-21 LAB — CBC
HCT: 44.4 % (ref 39.0–52.0)
HEMOGLOBIN: 14.7 g/dL (ref 13.0–17.0)
MCH: 28.9 pg (ref 26.0–34.0)
MCHC: 33.1 g/dL (ref 30.0–36.0)
MCV: 87.4 fL (ref 78.0–100.0)
Platelets: 213 10*3/uL (ref 150–400)
RBC: 5.08 MIL/uL (ref 4.22–5.81)
RDW: 13.8 % (ref 11.5–15.5)
WBC: 5.9 10*3/uL (ref 4.0–10.5)

## 2015-04-21 LAB — COMPREHENSIVE METABOLIC PANEL
ALT: 15 U/L — ABNORMAL LOW (ref 17–63)
AST: 22 U/L (ref 15–41)
Albumin: 3.9 g/dL (ref 3.5–5.0)
Alkaline Phosphatase: 67 U/L (ref 38–126)
Anion gap: 9 (ref 5–15)
BILIRUBIN TOTAL: 0.5 mg/dL (ref 0.3–1.2)
BUN: 14 mg/dL (ref 6–20)
CHLORIDE: 106 mmol/L (ref 101–111)
CO2: 23 mmol/L (ref 22–32)
Calcium: 9.4 mg/dL (ref 8.9–10.3)
Creatinine, Ser: 1.21 mg/dL (ref 0.61–1.24)
GFR calc non Af Amer: >60 mL/min (ref >60)
Glucose, Bld: 93 mg/dL (ref 65–99)
Potassium: 3.9 mmol/L (ref 3.5–5.1)
SODIUM: 138 mmol/L (ref 135–145)
TOTAL PROTEIN: 6.8 g/dL (ref 6.5–8.1)

## 2015-04-21 NOTE — Pre-Procedure Instructions (Addendum)
[facility]  04/21/2015      [facility] [facility] [place]  - [address] [address] [address] [facility] [address] [place] [address] Phone: [phone] Fax: [phone]  [facility] [place], [place] - [address] [place] [address] Phone: [phone] Fax: [phone]    Your procedure is scheduled on 04/28/15.  Report to [facility] short stay admitting at 600 A.M.  Call this number if you have problems the morning of surgery:  [phone]   Remember:  Do not eat food or drink liquids after midnight.  Take these medicines the morning of surgery with A SIP OF WATER tylenol if needed   STOP all herbel meds, nsaids (aleve,naproxen,advil,ibuprofen) 5 days prior to surgery starting 04/23/15 including aspirin, vitamins   Do not wear jewelry, make-up or nail polish.  Do not wear lotions, powders, or perfumes.  You may wear deodorant.  Do not shave 48 hours prior to surgery.  Men may shave face and neck.  Do not bring valuables to the hospital.  [facility] is not responsible for any belongings or valuables.  Contacts, dentures or bridgework may not be worn into surgery.  Leave your suitcase in the car.  After surgery it may be brought to your room.  For patients admitted to the hospital, discharge time will be determined by your treatment team.  Patients discharged the day of surgery will not be allowed to drive home.   Name and phone number of your driver:    Special instructions:   Special Instructions: [facility] - Preparing for Surgery  Before surgery, you can play an important role.  Because skin is not sterile, your skin needs to be as free of germs as possible.  You can reduce the number of germs on you skin by washing with CHG (chlorahexidine gluconate) soap before surgery.  CHG is an antiseptic cleaner which kills germs and bonds with the skin to continue killing germs even after  washing.  Please DO NOT use if you have an allergy to CHG or antibacterial soaps.  If your skin becomes reddened/irritated stop using the CHG and inform your nurse when you arrive at Short Stay.  Do not shave (including legs and underarms) for at least 48 hours prior to the first CHG shower.  You may shave your face.  Please follow these instructions carefully:   1.  Shower with CHG Soap the night before surgery and the morning of Surgery.  2.  If you choose to wash your hair, wash your hair first as usual with your normal shampoo.  3.  After you shampoo, rinse your hair and body thoroughly to remove the Shampoo.  4.  Use CHG as you would any other liquid soap.  You can apply chg directly  to the skin and wash gently with scrungie or a clean washcloth.  5.  Apply the CHG Soap to your body ONLY FROM THE NECK DOWN.  Do not use on open wounds or open sores.  Avoid contact with your eyes ears, mouth and genitals (private parts).  Wash genitals (private parts)       with your normal soap.  6.  Wash thoroughly, paying special attention to the area where your surgery will be performed.  7.  Thoroughly rinse your body with warm water from the neck down.  8.  DO NOT shower/wash with your normal soap after using and rinsing off the  CHG Soap.  9.  Pat yourself dry with a clean towel.            10.  Wear clean pajamas.            11.  Place clean sheets on your bed the night of your first shower and do not sleep with pets.  Day of Surgery  Do not apply any lotions/deodorants the morning of surgery.  Please wear clean clothes to the hospital/surgery center.  Please read over the following fact sheets that you were given. Pain Booklet, Coughing and Deep Breathing and Surgical Site Infection Prevention
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
## 2015-04-27 MED ORDER — CEFAZOLIN SODIUM-DEXTROSE 2-3 GM-% IV SOLR
2.0000 g | INTRAVENOUS | Status: AC
  Administered 2015-04-28: 2 g via INTRAVENOUS
  Filled 2015-04-27 (×2): qty 50

## 2015-04-27 MED ORDER — CHLORHEXIDINE GLUCONATE 4 % EX LIQD: 60.0000 mL | CUTANEOUS | Status: DC
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
## 2015-04-28 ENCOUNTER — Encounter (HOSPITAL_COMMUNITY)
Admission: RE | Disposition: A | Discharge status: Home / Self Care | Payer: Self-pay | Source: Ambulatory Visit | Attending: Orthopedic Surgery

## 2015-04-28 ENCOUNTER — Ambulatory Visit (HOSPITAL_COMMUNITY)
Admission: RE | Admit: 2015-04-28 | Discharge: 2015-04-28 | Disposition: A | Discharge status: Home / Self Care | Payer: Self-pay | Source: Ambulatory Visit | Attending: Orthopedic Surgery | Admitting: Orthopedic Surgery

## 2015-04-28 ENCOUNTER — Ambulatory Visit (HOSPITAL_COMMUNITY): Payer: Self-pay

## 2015-04-28 ENCOUNTER — Ambulatory Visit (HOSPITAL_COMMUNITY): Payer: No Typology Code available for payment source | Admitting: Vascular Surgery

## 2015-04-28 ENCOUNTER — Encounter (HOSPITAL_COMMUNITY): Payer: Self-pay | Admitting: Certified Registered Nurse Anesthetist

## 2015-04-28 ENCOUNTER — Ambulatory Visit (HOSPITAL_COMMUNITY): Payer: Self-pay | Admitting: Certified Registered Nurse Anesthetist

## 2015-04-28 DIAGNOSIS — F1290 Cannabis use, unspecified, uncomplicated: Secondary | ICD-10-CM | POA: Insufficient documentation

## 2015-04-28 DIAGNOSIS — Z6832 Body mass index (BMI) 32.0-32.9, adult: Secondary | ICD-10-CM | POA: Insufficient documentation

## 2015-04-28 DIAGNOSIS — M25571 Pain in right ankle and joints of right foot: Secondary | ICD-10-CM | POA: Insufficient documentation

## 2015-04-28 DIAGNOSIS — Z419 Encounter for procedure for purposes other than remedying health state, unspecified: Secondary | ICD-10-CM

## 2015-04-28 DIAGNOSIS — Y793 Surgical instruments, materials and orthopedic devices (including sutures) associated with adverse incidents: Secondary | ICD-10-CM | POA: Insufficient documentation

## 2015-04-28 DIAGNOSIS — F17210 Nicotine dependence, cigarettes, uncomplicated: Secondary | ICD-10-CM | POA: Insufficient documentation

## 2015-04-28 DIAGNOSIS — T8484XA Pain due to internal orthopedic prosthetic devices, implants and grafts, initial encounter: Secondary | ICD-10-CM | POA: Insufficient documentation

## 2015-04-28 HISTORY — PX: HARDWARE REMOVAL: SHX979

## 2015-04-28 LAB — SURGICAL PCR SCREEN
MRSA, PCR: NEGATIVE
Staphylococcus aureus: NEGATIVE

## 2015-04-28 SURGERY — REMOVAL, HARDWARE
Anesthesia: General | Site: Ankle | Laterality: Right

## 2015-04-28 MED ORDER — 0.9 % SODIUM CHLORIDE (POUR BTL) OPTIME
TOPICAL | Status: DC | PRN
  Administered 2015-04-28: 1000 mL

## 2015-04-28 MED ORDER — KETOROLAC TROMETHAMINE 30 MG/ML IJ SOLN
30.0000 mg | Freq: Once | INTRAMUSCULAR | Status: AC
  Administered 2015-04-28: 30 mg via INTRAVENOUS

## 2015-04-28 MED ORDER — LACTATED RINGERS IV SOLN
INTRAVENOUS | Status: DC | PRN
  Administered 2015-04-28: 08:00:00 via INTRAVENOUS

## 2015-04-28 MED ORDER — HYDROMORPHONE HCL 1 MG/ML IJ SOLN
0.2500 mg | INTRAMUSCULAR | Status: DC | PRN
  Administered 2015-04-28 (×4): 0.5 mg via INTRAVENOUS

## 2015-04-28 MED ORDER — SUCCINYLCHOLINE CHLORIDE 20 MG/ML IJ SOLN
INTRAMUSCULAR | Status: DC | PRN
  Administered 2015-04-28: 100 mg via INTRAVENOUS

## 2015-04-28 MED ORDER — DEXAMETHASONE SODIUM PHOSPHATE 4 MG/ML IJ SOLN
INTRAMUSCULAR | Status: DC | PRN
  Administered 2015-04-28: 8 mg via INTRAVENOUS

## 2015-04-28 MED ORDER — LIDOCAINE HCL (CARDIAC) 20 MG/ML IV SOLN
INTRAVENOUS | Status: DC | PRN
Start: 2015-04-28 — End: 2015-04-28
  Administered 2015-04-28: 60 mg via INTRAVENOUS

## 2015-04-28 MED ORDER — PROMETHAZINE HCL 25 MG/ML IJ SOLN: 6.2500 mg | INTRAMUSCULAR | Status: DC | PRN

## 2015-04-28 MED ORDER — FENTANYL CITRATE (PF) 250 MCG/5ML IJ SOLN
INTRAMUSCULAR | Status: AC
  Filled 2015-04-28: qty 5

## 2015-04-28 MED ORDER — MUPIROCIN 2 % EX OINT: TOPICAL_OINTMENT | Freq: Two times a day (BID) | CUTANEOUS | Status: DC

## 2015-04-28 MED ORDER — PROMETHAZINE HCL 12.5 MG PO TABS: 12.5000 mg | ORAL_TABLET | Freq: Four times a day (QID) | ORAL | Status: DC | PRN

## 2015-04-28 MED ORDER — STERILE WATER FOR INJECTION IJ SOLN
INTRAMUSCULAR | Status: AC
  Filled 2015-04-28: qty 10

## 2015-04-28 MED ORDER — DEXAMETHASONE SODIUM PHOSPHATE 4 MG/ML IJ SOLN
INTRAMUSCULAR | Status: AC
  Filled 2015-04-28: qty 2

## 2015-04-28 MED ORDER — ONDANSETRON HCL 4 MG/2ML IJ SOLN
INTRAMUSCULAR | Status: AC
  Filled 2015-04-28: qty 2

## 2015-04-28 MED ORDER — LIDOCAINE HCL (CARDIAC) 20 MG/ML IV SOLN
INTRAVENOUS | Status: AC
  Filled 2015-04-28: qty 10

## 2015-04-28 MED ORDER — MIDAZOLAM HCL 5 MG/5ML IJ SOLN
INTRAMUSCULAR | Status: DC | PRN
  Administered 2015-04-28: 2 mg via INTRAVENOUS

## 2015-04-28 MED ORDER — ONDANSETRON HCL 4 MG/2ML IJ SOLN
INTRAMUSCULAR | Status: DC | PRN
  Administered 2015-04-28: 4 mg via INTRAVENOUS

## 2015-04-28 MED ORDER — KETOROLAC TROMETHAMINE 30 MG/ML IJ SOLN
INTRAMUSCULAR | Status: AC
  Filled 2015-04-28: qty 1

## 2015-04-28 MED ORDER — LACTATED RINGERS IV SOLN
INTRAVENOUS | Status: DC
  Administered 2015-04-28: 08:00:00 via INTRAVENOUS

## 2015-04-28 MED ORDER — HYDROCODONE-ACETAMINOPHEN 5-325 MG PO TABS: 1.0000 | ORAL_TABLET | Freq: Four times a day (QID) | ORAL | Status: DC | PRN

## 2015-04-28 MED ORDER — SUCCINYLCHOLINE CHLORIDE 20 MG/ML IJ SOLN
INTRAMUSCULAR | Status: AC
Start: 2015-04-28 — End: 2015-04-28
  Filled 2015-04-28: qty 2

## 2015-04-28 MED ORDER — KETOROLAC TROMETHAMINE 10 MG PO TABS: 10.0000 mg | ORAL_TABLET | Freq: Four times a day (QID) | ORAL | Status: DC | PRN

## 2015-04-28 MED ORDER — MUPIROCIN 2 % EX OINT
TOPICAL_OINTMENT | Freq: Two times a day (BID) | CUTANEOUS | Status: DC
  Administered 2015-04-28: 1 via NASAL
  Filled 2015-04-28 (×2): qty 22

## 2015-04-28 MED ORDER — ROCURONIUM BROMIDE 50 MG/5ML IV SOLN
INTRAVENOUS | Status: AC
  Filled 2015-04-28: qty 1

## 2015-04-28 MED ORDER — FENTANYL CITRATE (PF) 100 MCG/2ML IJ SOLN
INTRAMUSCULAR | Status: DC | PRN
  Administered 2015-04-28 (×3): 50 ug via INTRAVENOUS

## 2015-04-28 MED ORDER — MIDAZOLAM HCL 2 MG/2ML IJ SOLN
INTRAMUSCULAR | Status: AC
  Filled 2015-04-28: qty 4

## 2015-04-28 MED ORDER — PROPOFOL 10 MG/ML IV BOLUS
INTRAVENOUS | Status: AC
  Filled 2015-04-28: qty 20

## 2015-04-28 MED ORDER — HYDROMORPHONE HCL 1 MG/ML IJ SOLN
INTRAMUSCULAR | Status: AC
  Filled 2015-04-28: qty 1

## 2015-04-28 MED ORDER — EPHEDRINE SULFATE 50 MG/ML IJ SOLN
INTRAMUSCULAR | Status: AC
  Filled 2015-04-28: qty 1

## 2015-04-28 MED ORDER — PROPOFOL 10 MG/ML IV BOLUS
INTRAVENOUS | Status: DC | PRN
  Administered 2015-04-28: 200 mg via INTRAVENOUS

## 2015-04-28 SURGICAL SUPPLY — 60 items
BANDAGE ELASTIC 4 VELCRO ST LF (GAUZE/BANDAGES/DRESSINGS) ×3 IMPLANT
BANDAGE ELASTIC 6 VELCRO ST LF (GAUZE/BANDAGES/DRESSINGS) ×3 IMPLANT
BANDAGE ESMARK 6X9 LF (GAUZE/BANDAGES/DRESSINGS) ×1 IMPLANT
BNDG COHESIVE 6X5 TAN STRL LF (GAUZE/BANDAGES/DRESSINGS) ×3 IMPLANT
BNDG ESMARK 6X9 LF (GAUZE/BANDAGES/DRESSINGS) ×3
BNDG GAUZE ELAST 4 BULKY (GAUZE/BANDAGES/DRESSINGS) ×3 IMPLANT
BRUSH SCRUB DISP (MISCELLANEOUS) ×6 IMPLANT
CLEANER TIP ELECTROSURG 2X2 (MISCELLANEOUS) ×3 IMPLANT
CLOSURE WOUND 1/2 X4 (GAUZE/BANDAGES/DRESSINGS)
COVER SURGICAL LIGHT HANDLE (MISCELLANEOUS) ×6 IMPLANT
CUFF TOURNIQUET SINGLE 18IN (TOURNIQUET CUFF) IMPLANT
CUFF TOURNIQUET SINGLE 24IN (TOURNIQUET CUFF) IMPLANT
CUFF TOURNIQUET SINGLE 34IN LL (TOURNIQUET CUFF) IMPLANT
DRAPE C-ARM 42X72 X-RAY (DRAPES) IMPLANT
DRAPE C-ARMOR (DRAPES) ×3 IMPLANT
DRAPE OEC MINIVIEW 54X84 (DRAPES) ×3 IMPLANT
DRAPE U-SHAPE 47X51 STRL (DRAPES) ×3 IMPLANT
DRSG ADAPTIC 3X8 NADH LF (GAUZE/BANDAGES/DRESSINGS) ×3 IMPLANT
ELECT REM PT RETURN 9FT ADLT (ELECTROSURGICAL) ×3
ELECTRODE REM PT RTRN 9FT ADLT (ELECTROSURGICAL) ×1 IMPLANT
EVACUATOR 1/8 PVC DRAIN (DRAIN) IMPLANT
GAUZE SPONGE 4X4 12PLY STRL (GAUZE/BANDAGES/DRESSINGS) ×3 IMPLANT
GLOVE BIO SURGEON STRL SZ7.5 (GLOVE) ×3 IMPLANT
GLOVE BIO SURGEON STRL SZ8 (GLOVE) ×3 IMPLANT
GLOVE BIOGEL PI IND STRL 7.5 (GLOVE) ×1 IMPLANT
GLOVE BIOGEL PI IND STRL 8 (GLOVE) ×1 IMPLANT
GLOVE BIOGEL PI INDICATOR 7.5 (GLOVE) ×2
GLOVE BIOGEL PI INDICATOR 8 (GLOVE) ×2
GOWN STRL REUS W/ TWL LRG LVL3 (GOWN DISPOSABLE) ×2 IMPLANT
GOWN STRL REUS W/ TWL XL LVL3 (GOWN DISPOSABLE) ×1 IMPLANT
GOWN STRL REUS W/TWL LRG LVL3 (GOWN DISPOSABLE) ×4
GOWN STRL REUS W/TWL XL LVL3 (GOWN DISPOSABLE) ×2
KIT BASIN OR (CUSTOM PROCEDURE TRAY) ×3 IMPLANT
KIT ROOM TURNOVER OR (KITS) ×3 IMPLANT
MANIFOLD NEPTUNE II (INSTRUMENTS) ×3 IMPLANT
NEEDLE 22X1 1/2 (OR ONLY) (NEEDLE) IMPLANT
NS IRRIG 1000ML POUR BTL (IV SOLUTION) ×3 IMPLANT
PACK ORTHO EXTREMITY (CUSTOM PROCEDURE TRAY) ×3 IMPLANT
PAD ARMBOARD 7.5X6 YLW CONV (MISCELLANEOUS) ×6 IMPLANT
PADDING CAST COTTON 6X4 STRL (CAST SUPPLIES) ×9 IMPLANT
SPONGE LAP 18X18 X RAY DECT (DISPOSABLE) ×3 IMPLANT
SPONGE SCRUB IODOPHOR (GAUZE/BANDAGES/DRESSINGS) ×3 IMPLANT
STAPLER VISISTAT 35W (STAPLE) IMPLANT
STOCKINETTE IMPERVIOUS LG (DRAPES) ×3 IMPLANT
STRIP CLOSURE SKIN 1/2X4 (GAUZE/BANDAGES/DRESSINGS) IMPLANT
SUCTION FRAZIER TIP 10 FR DISP (SUCTIONS) IMPLANT
SUT ETHILON 3 0 PS 1 (SUTURE) IMPLANT
SUT PDS AB 2-0 CT1 27 (SUTURE) IMPLANT
SUT VIC AB 0 CT1 27 (SUTURE)
SUT VIC AB 0 CT1 27XBRD ANBCTR (SUTURE) IMPLANT
SUT VIC AB 2-0 CT1 27 (SUTURE)
SUT VIC AB 2-0 CT1 TAPERPNT 27 (SUTURE) IMPLANT
SYR CONTROL 10ML LL (SYRINGE) IMPLANT
TOWEL OR 17X24 6PK STRL BLUE (TOWEL DISPOSABLE) ×6 IMPLANT
TOWEL OR 17X26 10 PK STRL BLUE (TOWEL DISPOSABLE) ×6 IMPLANT
TUBE CONNECTING 12'X1/4 (SUCTIONS) ×1
TUBE CONNECTING 12X1/4 (SUCTIONS) ×2 IMPLANT
UNDERPAD 30X30 INCONTINENT (UNDERPADS AND DIAPERS) ×3 IMPLANT
WATER STERILE IRR 1000ML POUR (IV SOLUTION) ×6 IMPLANT
YANKAUER SUCT BULB TIP NO VENT (SUCTIONS) ×3 IMPLANT

## 2015-04-28 NOTE — Anesthesia Procedure Notes (Signed)
Procedure Name: Intubation Date/Time: 04/28/2015 8:09 AM Performed by: Rise Patience T Pre-anesthesia Checklist: Patient identified, Emergency Drugs available, Suction available and Patient being monitored Patient Re-evaluated:Patient Re-evaluated prior to inductionOxygen Delivery Method: Circle system utilized Preoxygenation: Pre-oxygenation with 100% oxygen Intubation Type: IV induction and Rapid sequence Ventilation: Mask ventilation without difficulty Laryngoscope Size: Miller and 2 Grade View: Grade I Tube type: Oral Tube size: 7.5 mm Number of attempts: 1 Airway Equipment and Method: Stylet Placement Confirmation: ETT inserted through vocal cords under direct vision,  positive ETCO2 and breath sounds checked- equal and bilateral Secured at: 22 cm Tube secured with: Tape Dental Injury: Teeth and Oropharynx as per pre-operative assessment

## 2015-04-28 NOTE — Anesthesia Preprocedure Evaluation (Addendum)
Anesthesia Evaluation  Patient identified by MRN, date of birth, ID band Patient awake    Reviewed: Allergy & Precautions, NPO status , Patient's Chart, lab work & pertinent test results  Airway Mallampati: II  TM Distance: >3 FB Neck ROM: Full    Dental no notable dental hx. (+) Dental Advisory Given,    Pulmonary Current Smoker,  breath sounds clear to auscultation  Pulmonary exam normal       Cardiovascular negative cardio ROS Normal cardiovascular examRhythm:Regular Rate:Normal     Neuro/Psych negative neurological ROS  negative psych ROS   GI/Hepatic negative GI ROS, (+)     substance abuse  marijuana use,   Endo/Other  negative endocrine ROSMorbid obesity  Renal/GU negative Renal ROS  negative genitourinary   Musculoskeletal negative musculoskeletal ROS (+)   Abdominal   Peds negative pediatric ROS (+)  Hematology negative hematology ROS (+)   Anesthesia Other Findings   Reproductive/Obstetrics negative OB ROS                          Anesthesia Physical Anesthesia Plan  ASA: II  Anesthesia Plan: General   Post-op Pain Management:    Induction: Intravenous and Rapid sequence  Airway Management Planned: Oral ETT  Additional Equipment:   Intra-op Plan:   Post-operative Plan: Extubation in OR  Informed Consent: I have reviewed the patients History and Physical, chart, labs and discussed the procedure including the risks, benefits and alternatives for the proposed anesthesia with the patient or authorized representative who has indicated his/her understanding and acceptance.   Dental advisory given  Plan Discussed with: CRNA and Surgeon  Anesthesia Plan Comments: (Liquids at 0600)     Anesthesia Quick Evaluation

## 2015-04-28 NOTE — Anesthesia Postprocedure Evaluation (Signed)
  Anesthesia Post-op Note  Patient: [name] Hart  Procedure(s) Performed: Procedure(s) (LRB): HARDWARE REMOVAL RIGHT ANKLE, five screws 1 plate, and 1 washer removed (Right)  Patient Location: PACU  Anesthesia Type: General  Level of Consciousness: awake and alert   Airway and Oxygen Therapy: Patient Spontanous Breathing  Post-op Pain: mild  Post-op Assessment: Post-op Vital signs reviewed, Patient's Cardiovascular Status Stable, Respiratory Function Stable, Patent Airway and No signs of Nausea or vomiting  Last Vitals:  Filed Vitals:   04/28/15 0930  BP: 123/73  Pulse: 65  Temp:   Resp: 17    Post-op Vital Signs: stable   Complications: No apparent anesthesia complications

## 2015-04-28 NOTE — Op Note (Signed)
NAMEMIKIAH, DEMOND              ACCOUNT NO.:  [id number]  MEDICAL RECORD NO.:  [id number]  LOCATION:  [facility]                         FACILITY:  [facility]  PHYSICIAN:  Doralee Albino. Carola Frost, M.D. DATE OF BIRTH:  05/30/1986  DATE OF PROCEDURE:  04/28/2015 DATE OF DISCHARGE:  04/28/2015                              OPERATIVE REPORT   PREOPERATIVE DIAGNOSIS:  Symptomatic hardware, right ankle.  POSTOPERATIVE DIAGNOSIS:  Symptomatic hardware, right ankle.  PROCEDURE:  Removal of multiple deep implants, right ankle.  SURGEON:  Doralee Albino. Carola Frost, M.D.  ASSISTANT:  Montez Morita, PA-C.  ANESTHESIA:  General.  COMPLICATIONS:  None.  TOURNIQUET:  None.  DISPOSITION:  PACU.  CONDITION:  Stable.  BRIEF SUMMARY AND INDICATION FOR PROCEDURE:  Jonthan Leite is a 29 year old male status post right ankle pilon fracture involving the tibia, fibula, and syndesmosis.  He has gone on to unite but has had persistent pain over the implants medially and laterally as well as stiffness.  I discussed with him the risks and benefits of removal of his fixation including the possibility of failure to alleviate all the symptoms, the possibility of removing all of it which could result in a slower recovery, DVT, PE, and multiple others.  The patient acknowledged these risks and wished to proceed.  BRIEF SUMMARY OF PROCEDURE:  Adarian received Ancef preoperatively.  He was taken to the operating room where general anesthesia was induced. His right lower extremity was prepped and draped in usual sterile fashion.  Tourniquet was placed but was never used during the procedure. We remade the old medial incisions first and removed two lag screws, one with a washer, used C-arm to localize another lag screw, then placed to the primary incision and then was able to remove that without difficulty.  The lateral plate and syndesmotic screws were then exposed and removed.  All wounds were irrigated and closed in standard  fashion using a 2-0 Vicryl and 3-0 nylon.  Sterile gentle compressive dressing was applied.  The patient was then taken to PACU in stable condition. Montez Morita, PA-C did assist me throughout to expedite the procedure.  PROGNOSIS:  The patient will be weightbearing as tolerated with unrestricted range of motion on the right.  He can shower in 2 days. Plan to see him back in the office for removal of the sutures in 10-14 days.     Doralee Albino. Carola Frost, M.D.     [facility]/MEDQ  D:  04/28/2015  T:  04/28/2015  Job:  [id number]

## 2015-04-28 NOTE — OR Nursing (Signed)
Explanted hardware removed and sent to OR processing to be cleaned. OR processing to return hardware to [place], [place].

## 2015-04-28 NOTE — Brief Op Note (Signed)
04/28/2015  9:49 AM  PATIENT:  [name]  29 y.o. male  PRE-OPERATIVE DIAGNOSIS:  SYMPTOMATIC HARDWARE RIGHT ANKLE  POST-OPERATIVE DIAGNOSIS:  SYMPTOMATIC HARDWARE RIGHT ANKLE  PROCEDURE:  Procedure(s): HARDWARE REMOVAL RIGHT ANKLE, five screws 1 plate, and 1 washer removed (Right)  SURGEON:  Surgeon(s) and Role:    * Myrene Galas, MD - Primary  PHYSICIAN ASSISTANT: Montez Morita, PA-C  ANESTHESIA:   general  I/O:  Total I/O In: 600 [I.V.:600] Out: 20 [Blood:20]  SPECIMEN:  No Specimen  TOURNIQUET:  None  DICTATION: .Other Dictation: Dictation Number [id number]

## 2015-04-28 NOTE — Discharge Instructions (Signed)
[facility] Discharge Instructions   General Discharge Instructions  WEIGHT BEARING STATUS: weightbearing as tolerated  RANGE OF MOTION/ACTIVITY: Range of motion as tolerated, no restrictions  PAIN MEDICATION USE AND EXPECTATIONS  You have likely been given narcotic medications to help control your pain.  After a traumatic event that results in an fracture (broken bone) with or without surgery, it is ok to use narcotic pain medications to help control one's pain.  We understand that everyone responds to pain differently and each individual patient will be evaluated on a regular basis for the continued need for narcotic medications. Ideally, narcotic medication use should last no more than 6-8 weeks (coinciding with fracture healing).   As a patient it is your responsibility as well to monitor narcotic medication use and report the amount and frequency you use these medications when you come to your office visit.   We would also advise that if you are using narcotic medications, you should take a dose prior to therapy to maximize you participation.  IF YOU ARE ON NARCOTIC MEDICATIONS IT IS NOT PERMISSIBLE TO OPERATE A MOTOR VEHICLE (MOTORCYCLE/CAR/TRUCK/MOPED) OR HEAVY MACHINERY DO NOT MIX NARCOTICS WITH OTHER CNS (CENTRAL NERVOUS SYSTEM) DEPRESSANTS SUCH AS ALCOHOL  Diet: as you were eating previously.  Can use over the counter stool softeners and bowel preparations, such as Miralax, to help with bowel movements.  Narcotics can be constipating.  Be sure to drink plenty of fluids  Wound Care: daily dressing changes starting on 05/01/2015. See instructions below   STOP SMOKING OR USING NICOTINE PRODUCTS!!!!  As discussed nicotine severely impairs your body's ability to heal surgical and traumatic wounds but also impairs bone healing.  Wounds and bone heal by forming microscopic blood vessels (angiogenesis) and nicotine is a vasoconstrictor (essentially, shrinks blood vessels).   Therefore, if vasoconstriction occurs to these microscopic blood vessels they essentially disappear and are unable to deliver necessary nutrients to the healing tissue.  This is one modifiable factor that you can do to dramatically increase your chances of healing your injury.    (This means no smoking, no nicotine gum, patches, etc)  DO NOT USE NONSTEROIDAL ANTI-INFLAMMATORY DRUGS (NSAID'S)  Using products such as Advil (ibuprofen), Aleve (naproxen), Motrin (ibuprofen) for additional pain control during fracture healing can delay and/or prevent the healing response.  If you would like to take over the counter (OTC) medication, Tylenol (acetaminophen) is ok.  However, some narcotic medications that are given for pain control contain acetaminophen as well. Therefore, you should not exceed more than 4000 mg of tylenol in a day if you do not have liver disease.  Also note that there are may OTC medicines, such as cold medicines and allergy medicines that my contain tylenol as well.  If you have any questions about medications and/or interactions please ask your doctor/PA or your pharmacist.      ICE AND ELEVATE INJURED/OPERATIVE EXTREMITY  Using ice and elevating the injured extremity above your heart can help with swelling and pain control.  Icing in a pulsatile fashion, such as 20 minutes on and 20 minutes off, can be followed.    Do not place ice directly on skin. Make sure there is a barrier between to skin and the ice pack.    Using frozen items such as frozen peas works well as the conform nicely to the are that needs to be iced.  USE AN ACE WRAP OR TED HOSE FOR SWELLING CONTROL  In addition to icing and elevation,  Ace wraps or TED hose are used to help limit and resolve swelling.  It is recommended to use Ace wraps or TED hose until you are informed to stop.    When using Ace Wraps start the wrapping distally (farthest away from the body) and wrap proximally (closer to the body)   Example: If you  had surgery on your leg or thing and you do not have a splint on, start the ace wrap at the toes and work your way up to the thigh        If you had surgery on your upper extremity and do not have a splint on, start the ace wrap at your fingers and work your way up to the upper arm  IF YOU ARE IN A SPLINT OR CAST DO NOT REMOVE IT FOR ANY REASON   If your splint gets wet for any reason please contact the office immediately. You may shower in your splint or cast as long as you keep it dry.  This can be done by wrapping in a cast cover or garbage back (or similar)  Do Not stick any thing down your splint or cast such as pencils, money, or hangers to try and scratch yourself with.  If you feel itchy take benadryl as prescribed on the bottle for itching  IF YOU ARE IN A CAM BOOT (BLACK BOOT)  You may remove boot periodically. Perform daily dressing changes as noted below.  Wash the liner of the boot regularly and wear a sock when wearing the boot. It is recommended that you sleep in the boot until told otherwise  CALL THE OFFICE WITH ANY QUESTIONS OR CONCERTS: [phone]     Discharge Pin Site Instructions  Dress pins daily with Kerlix roll starting on POD 2. Wrap the Kerlix so that it tamps the skin down around the pin-skin interface to prevent/limit motion of the skin relative to the pin.  (Pin-skin motion is the primary cause of pain and infection related to external fixator pin sites).  Remove any crust or coagulum that may obstruct drainage with a saline moistened gauze or soap and water.  After POD 3, if there is no discernable drainage on the pin site dressing, the interval for change can by increased to every other day.  You may shower with the fixator, cleaning all pin sites gently with soap and water.  If you have a surgical wound this needs to be completely dry and without drainage before showering.  The extremity can be lifted by the fixator to facilitate wound care and  transfers.  Notify the office/Doctor if you experience increasing drainage, redness, or pain from a pin site, or if you notice purulent (thick, snot-like) drainage.  Discharge Wound Care Instructions  Do NOT apply any ointments, solutions or lotions to pin sites or surgical wounds.  These prevent needed drainage and even though solutions like hydrogen peroxide kill bacteria, they also damage cells lining the pin sites that help fight infection.  Applying lotions or ointments can keep the wounds moist and can cause them to breakdown and open up as well. This can increase the risk for infection. When in doubt call the office.  Surgical incisions should be dressed daily.  If any drainage is noted, use one layer of adaptic, then gauze, Kerlix, and an ace wrap.  Once the incision is completely dry and without drainage, it may be left open to air out.  Showering may begin 36-48 hours later.  Cleaning gently with  soap and water.  Traumatic wounds should be dressed daily as well.    One layer of adaptic, gauze, Kerlix, then ace wrap.  The adaptic can be discontinued once the draining has ceased    If you have a wet to dry dressing: wet the gauze with saline the squeeze as much saline out so the gauze is moist (not soaking wet), place moistened gauze over wound, then place a dry gauze over the moist one, followed by Kerlix wrap, then ace wrap.

## 2015-04-28 NOTE — Transfer of Care (Signed)
Immediate Anesthesia Transfer of Care Note  Patient: [facility]  Procedure(s) Performed: Procedure(s): HARDWARE REMOVAL RIGHT ANKLE, five screws 1 plate, and 1 washer removed (Right)  Patient Location: PACU  Anesthesia Type:General  Level of Consciousness: awake, alert  and oriented  Airway & Oxygen Therapy: Patient Spontanous Breathing and Patient connected to nasal cannula oxygen  Post-op Assessment: Report given to RN, Post -op Vital signs reviewed and stable and Patient moving all extremities X 4  Post vital signs: Reviewed and stable  Last Vitals:  Filed Vitals:   04/28/15 0743  BP:   Pulse:   Temp: 36.5 C  Resp:     Complications: No apparent anesthesia complications

## 2015-04-29 ENCOUNTER — Encounter (HOSPITAL_COMMUNITY): Payer: Self-pay | Admitting: Orthopedic Surgery

## 2015-08-03 ENCOUNTER — Emergency Department (HOSPITAL_COMMUNITY)
Admission: EM | Admit: 2015-08-03 | Discharge: 2015-08-03 | Disposition: A | Discharge status: Home / Self Care | Payer: Self-pay | Attending: Emergency Medicine | Admitting: Emergency Medicine

## 2015-08-03 ENCOUNTER — Emergency Department (HOSPITAL_COMMUNITY): Payer: Self-pay

## 2015-08-03 ENCOUNTER — Encounter (HOSPITAL_COMMUNITY): Payer: Self-pay | Admitting: Emergency Medicine

## 2015-08-03 DIAGNOSIS — F17210 Nicotine dependence, cigarettes, uncomplicated: Secondary | ICD-10-CM | POA: Insufficient documentation

## 2015-08-03 DIAGNOSIS — Y998 Other external cause status: Secondary | ICD-10-CM | POA: Insufficient documentation

## 2015-08-03 DIAGNOSIS — Z8781 Personal history of (healed) traumatic fracture: Secondary | ICD-10-CM | POA: Insufficient documentation

## 2015-08-03 DIAGNOSIS — Y9389 Activity, other specified: Secondary | ICD-10-CM | POA: Insufficient documentation

## 2015-08-03 DIAGNOSIS — Z792 Long term (current) use of antibiotics: Secondary | ICD-10-CM | POA: Insufficient documentation

## 2015-08-03 DIAGNOSIS — S93402A Sprain of unspecified ligament of left ankle, initial encounter: Secondary | ICD-10-CM | POA: Insufficient documentation

## 2015-08-03 DIAGNOSIS — Y92410 Unspecified street and highway as the place of occurrence of the external cause: Secondary | ICD-10-CM | POA: Insufficient documentation

## 2015-08-03 DIAGNOSIS — Z8619 Personal history of other infectious and parasitic diseases: Secondary | ICD-10-CM | POA: Insufficient documentation

## 2015-08-03 DIAGNOSIS — Z79899 Other long term (current) drug therapy: Secondary | ICD-10-CM | POA: Insufficient documentation

## 2015-08-03 MED ORDER — METHOCARBAMOL 500 MG PO TABS: 500.0000 mg | ORAL_TABLET | Freq: Two times a day (BID) | ORAL | Status: DC
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
## 2015-08-03 NOTE — Discharge Instructions (Signed)
For pain control you may take up to 800mg  of Motrin (also known as ibuprofen). That is usually 4 over the counter pills,  3 times a day. Take with food to minimize stomach irritation   You can also take  tylenol (acetaminophen) 975mg  (this is 3 over the counter pills) four times a day. Do not drink alcohol or combine with other medications that have acetaminophen as an ingredient (Read the labels!).    For breakthrough pain you may take Robaxin. Do not drink alcohol, drive or operate heavy machinery when taking Robaxin.  Please follow with your primary care doctor in the next 2 days for a check-up. They must obtain records for further management.   Do not hesitate to return to the Emergency Department for any new, worsening or concerning symptoms.    Ankle Sprain An ankle sprain is an injury to the strong, fibrous tissues (ligaments) that hold the bones of your ankle joint together.  CAUSES An ankle sprain is usually caused by a fall or by twisting your ankle. Ankle sprains most commonly occur when you step on the outer edge of your foot, and your ankle turns inward. People who participate in sports are more prone to these types of injuries.  SYMPTOMS   Pain in your ankle. The pain may be present at rest or only when you are trying to stand or walk.  Swelling.  Bruising. Bruising may develop immediately or within 1 to 2 days after your injury.  Difficulty standing or walking, particularly when turning corners or changing directions. DIAGNOSIS  Your caregiver will ask you details about your injury and perform a physical exam of your ankle to determine if you have an ankle sprain. During the physical exam, your caregiver will press on and apply pressure to specific areas of your foot and ankle. Your caregiver will try to move your ankle in certain ways. An X-ray exam may be done to be sure a bone was not broken or a ligament did not separate from one of the bones in your ankle (avulsion  fracture).  TREATMENT  Certain types of braces can help stabilize your ankle. Your caregiver can make a recommendation for this. Your caregiver may recommend the use of medicine for pain. If your sprain is severe, your caregiver may refer you to a surgeon who helps to restore function to parts of your skeletal system (orthopedist) or a physical therapist. HOME CARE INSTRUCTIONS   Apply ice to your injury for 1-2 days or as directed by your caregiver. Applying ice helps to reduce inflammation and pain.  Put ice in a plastic bag.  Place a towel between your skin and the bag.  Leave the ice on for 15-20 minutes at a time, every 2 hours while you are awake.  Only take over-the-counter or prescription medicines for pain, discomfort, or fever as directed by your caregiver.  Elevate your injured ankle above the level of your heart as much as possible for 2-3 days.  If your caregiver recommends crutches, use them as instructed. Gradually put weight on the affected ankle. Continue to use crutches or a cane until you can walk without feeling pain in your ankle.  If you have a plaster splint, wear the splint as directed by your caregiver. Do not rest it on anything harder than a pillow for the first 24 hours. Do not put weight on it. Do not get it wet. You may take it off to take a shower or bath.  You may  have been given an elastic bandage to wear around your ankle to provide support. If the elastic bandage is too tight (you have numbness or tingling in your foot or your foot becomes cold and blue), adjust the bandage to make it comfortable.  If you have an air splint, you may blow more air into it or let air out to make it more comfortable. You may take your splint off at night and before taking a shower or bath. Wiggle your toes in the splint several times per day to decrease swelling. SEEK MEDICAL CARE IF:   You have rapidly increasing bruising or swelling.  Your toes feel extremely cold or you  lose feeling in your foot.  Your pain is not relieved with medicine. SEEK IMMEDIATE MEDICAL CARE IF:  Your toes are numb or blue.  You have severe pain that is increasing. MAKE SURE YOU:   Understand these instructions.  Will watch your condition.  Will get help right away if you are not doing well or get worse.   This information is not intended to replace advice given to you by your health care provider. Make sure you discuss any questions you have with your health care provider.   Document Released: 08/29/2005 Document Revised: 09/19/2014 Document Reviewed: 09/10/2011 Elsevier Interactive Patient Education [employer]
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
## 2016-04-01 ENCOUNTER — Emergency Department (HOSPITAL_COMMUNITY)
Admission: EM | Admit: 2016-04-01 | Discharge: 2016-04-01 | Disposition: A | Discharge status: Home / Self Care | Payer: No Typology Code available for payment source | Attending: Emergency Medicine | Admitting: Emergency Medicine

## 2016-04-01 ENCOUNTER — Encounter (HOSPITAL_COMMUNITY): Payer: Self-pay | Admitting: Emergency Medicine

## 2016-04-01 ENCOUNTER — Emergency Department (HOSPITAL_COMMUNITY): Payer: No Typology Code available for payment source

## 2016-04-01 DIAGNOSIS — Y93E5 Activity, floor mopping and cleaning: Secondary | ICD-10-CM | POA: Insufficient documentation

## 2016-04-01 DIAGNOSIS — S90111A Contusion of right great toe without damage to nail, initial encounter: Secondary | ICD-10-CM | POA: Insufficient documentation

## 2016-04-01 DIAGNOSIS — W228XXA Striking against or struck by other objects, initial encounter: Secondary | ICD-10-CM | POA: Insufficient documentation

## 2016-04-01 DIAGNOSIS — Z79899 Other long term (current) drug therapy: Secondary | ICD-10-CM | POA: Insufficient documentation

## 2016-04-01 DIAGNOSIS — F17210 Nicotine dependence, cigarettes, uncomplicated: Secondary | ICD-10-CM | POA: Insufficient documentation

## 2016-04-01 DIAGNOSIS — Y999 Unspecified external cause status: Secondary | ICD-10-CM | POA: Insufficient documentation

## 2016-04-01 DIAGNOSIS — Y9289 Other specified places as the place of occurrence of the external cause: Secondary | ICD-10-CM | POA: Insufficient documentation

## 2016-04-01 MED ORDER — IBUPROFEN 400 MG PO TABS
600.0000 mg | ORAL_TABLET | Freq: Once | ORAL | Status: AC
  Administered 2016-04-01: 600 mg via ORAL
  Filled 2016-04-01: qty 1

## 2016-04-01 MED ORDER — NAPROXEN 500 MG PO TABS: 500.0000 mg | ORAL_TABLET | Freq: Two times a day (BID) | ORAL | Status: DC

## 2016-04-01 NOTE — ED Notes (Signed)
Patient came with his own crutches of which he carried in with him ambulating from the  Main ED waiting area to FT6 without any difficulty or distress bearing weight

## 2016-04-01 NOTE — ED Notes (Signed)
Pt transported to xray 
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
## 2016-04-01 NOTE — ED Notes (Signed)
Patient states was cleaning and he kicked a weight by accident with his R great toe.   Patient states some swelling and pain.  Patient denies other injury.

## 2016-04-01 NOTE — Discharge Instructions (Signed)
Your x-ray was normal. Take medication as prescribed as needed for pain. You may ice the area on and off for the next 48 hr to help with pain and swelling. Return to the ER for new or worsening symptoms.

## 2016-06-28 IMAGING — CR DG ANKLE COMPLETE 3+V*R*
3 series · 3 of 3 positions shown · non-contrast
Comparison: None

CLINICAL DATA: Fracture of distal tibia

EXAM:
RIGHT ANKLE - COMPLETE 3+ VIEW

[ap obl int rot]
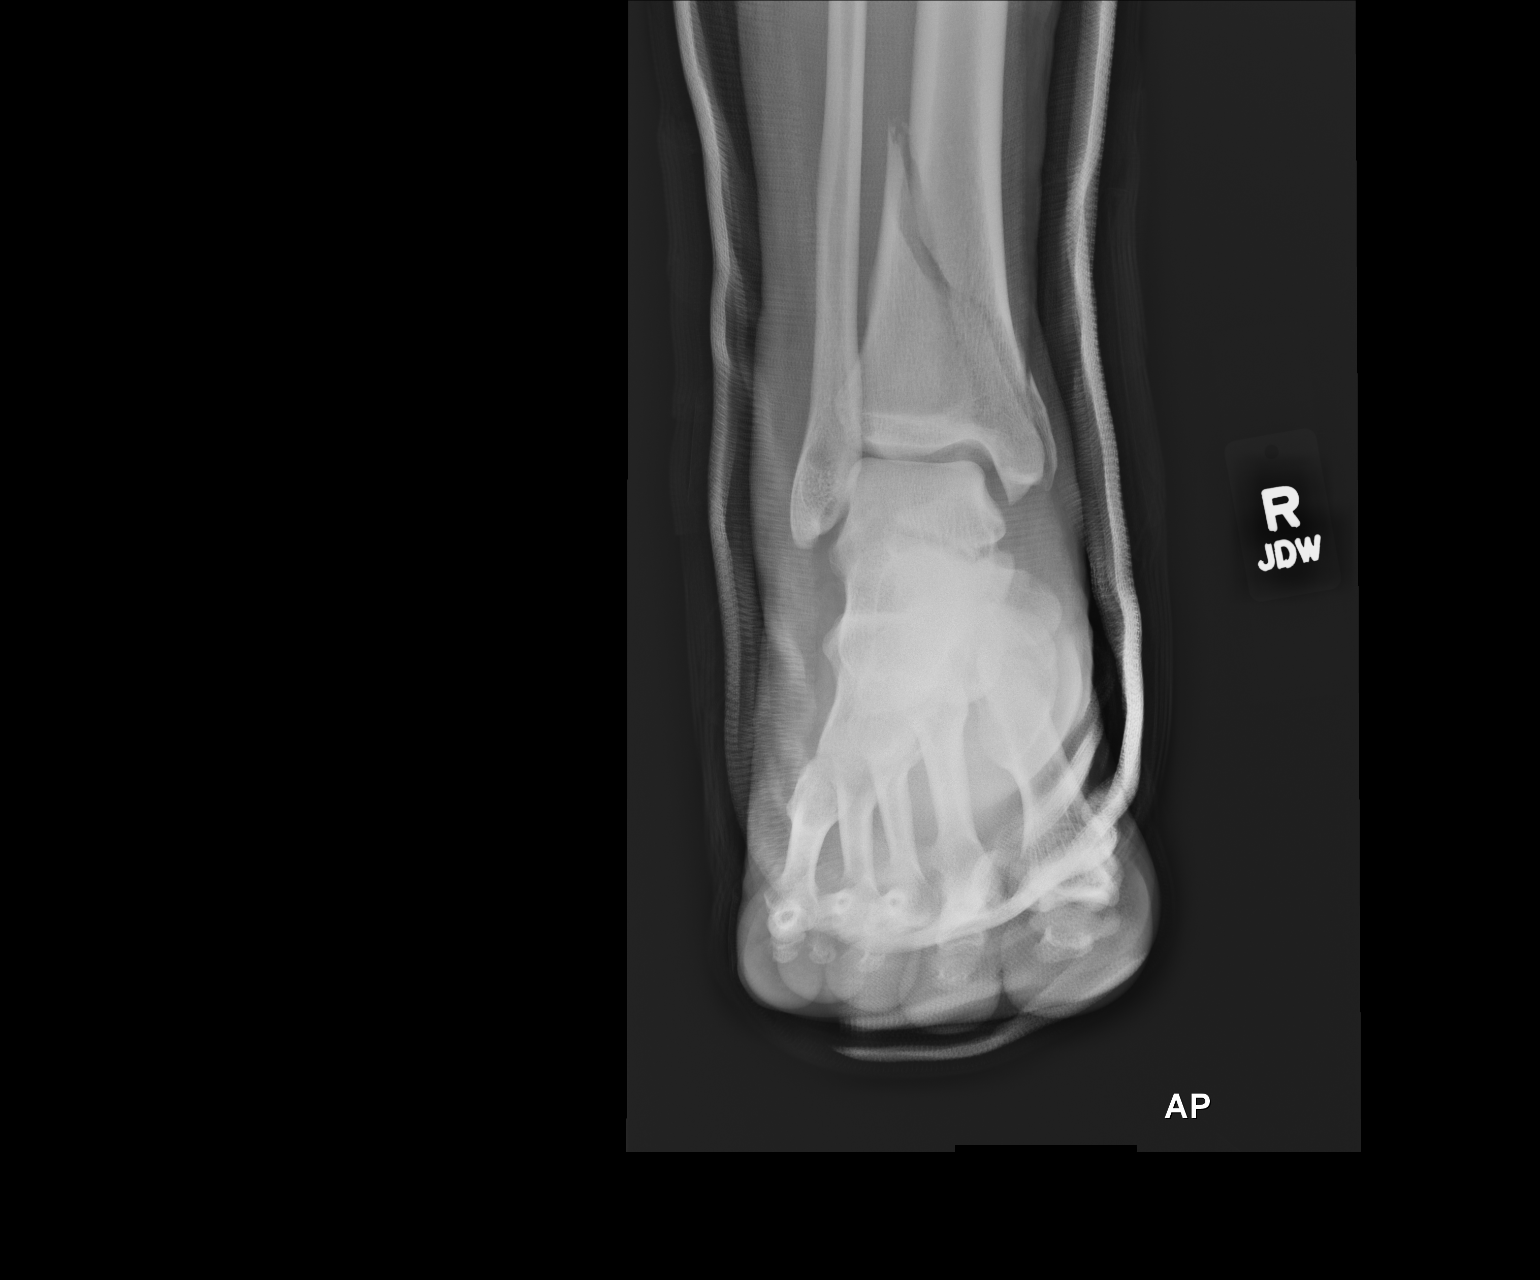

[lateral (1 of 2)]
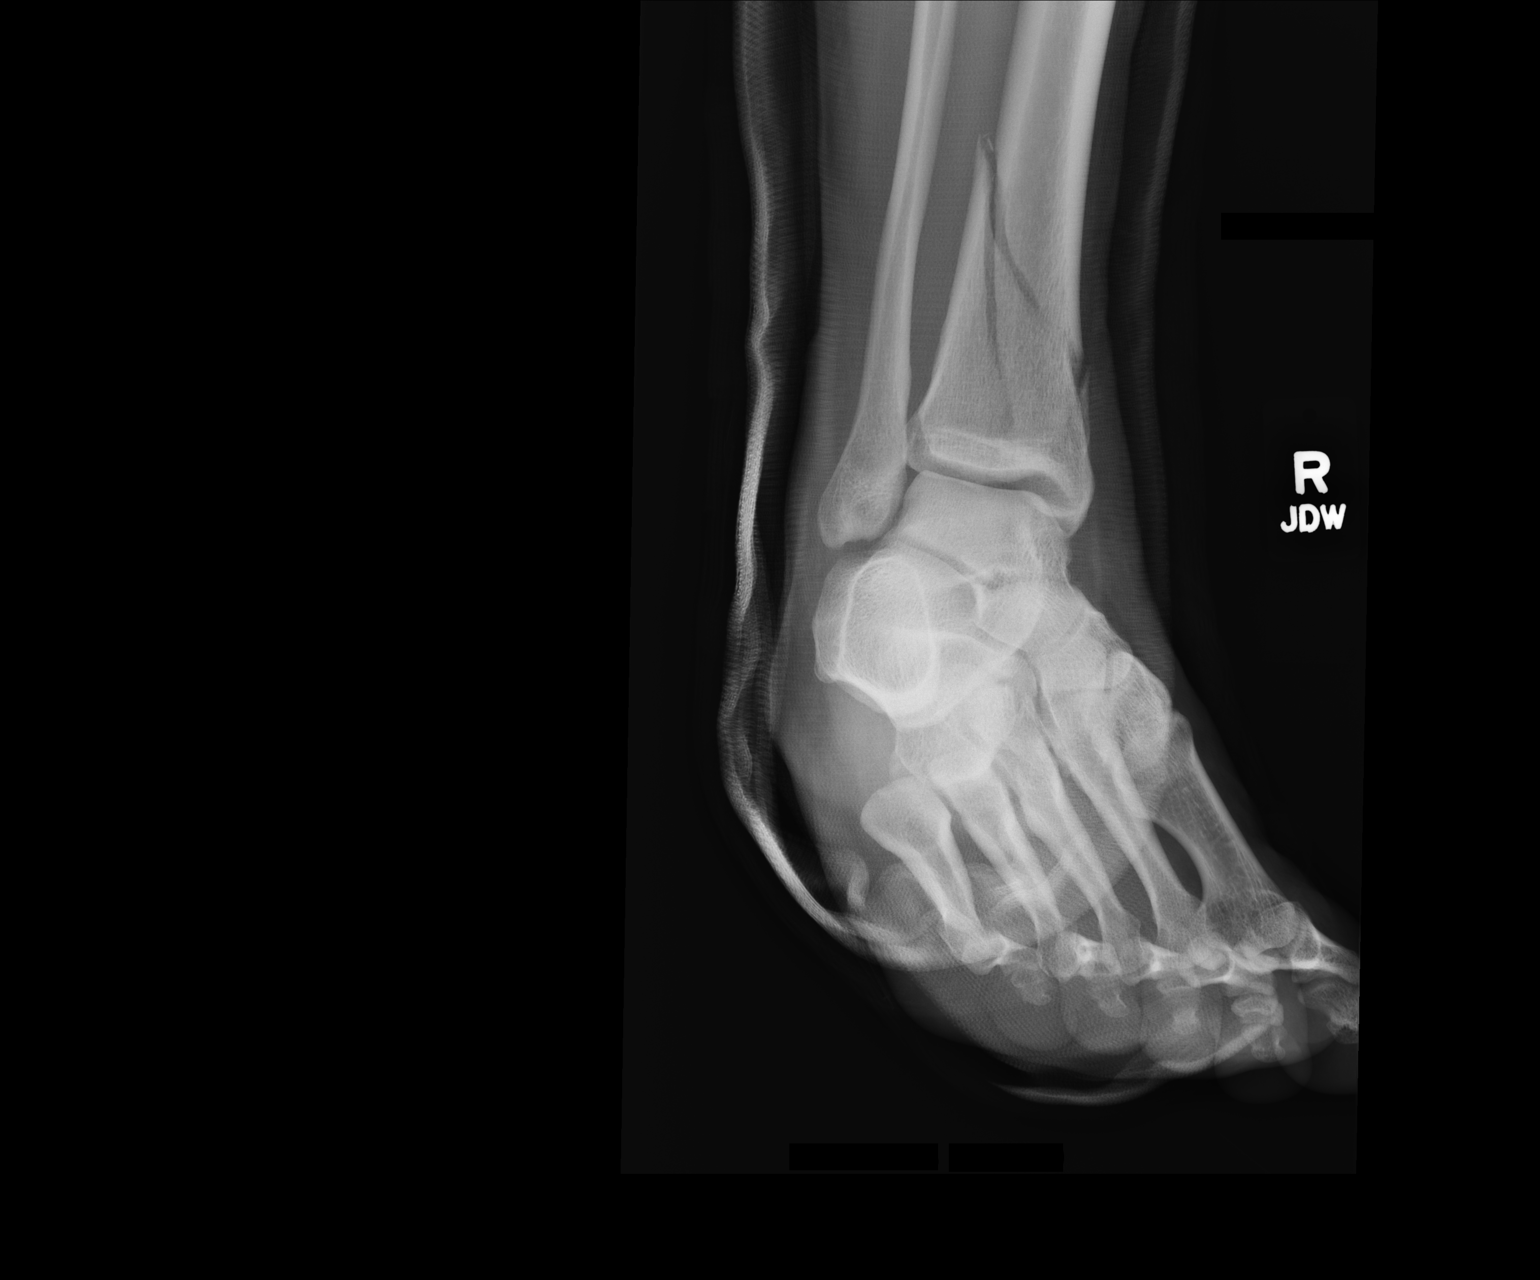

[lateral (2 of 2)]
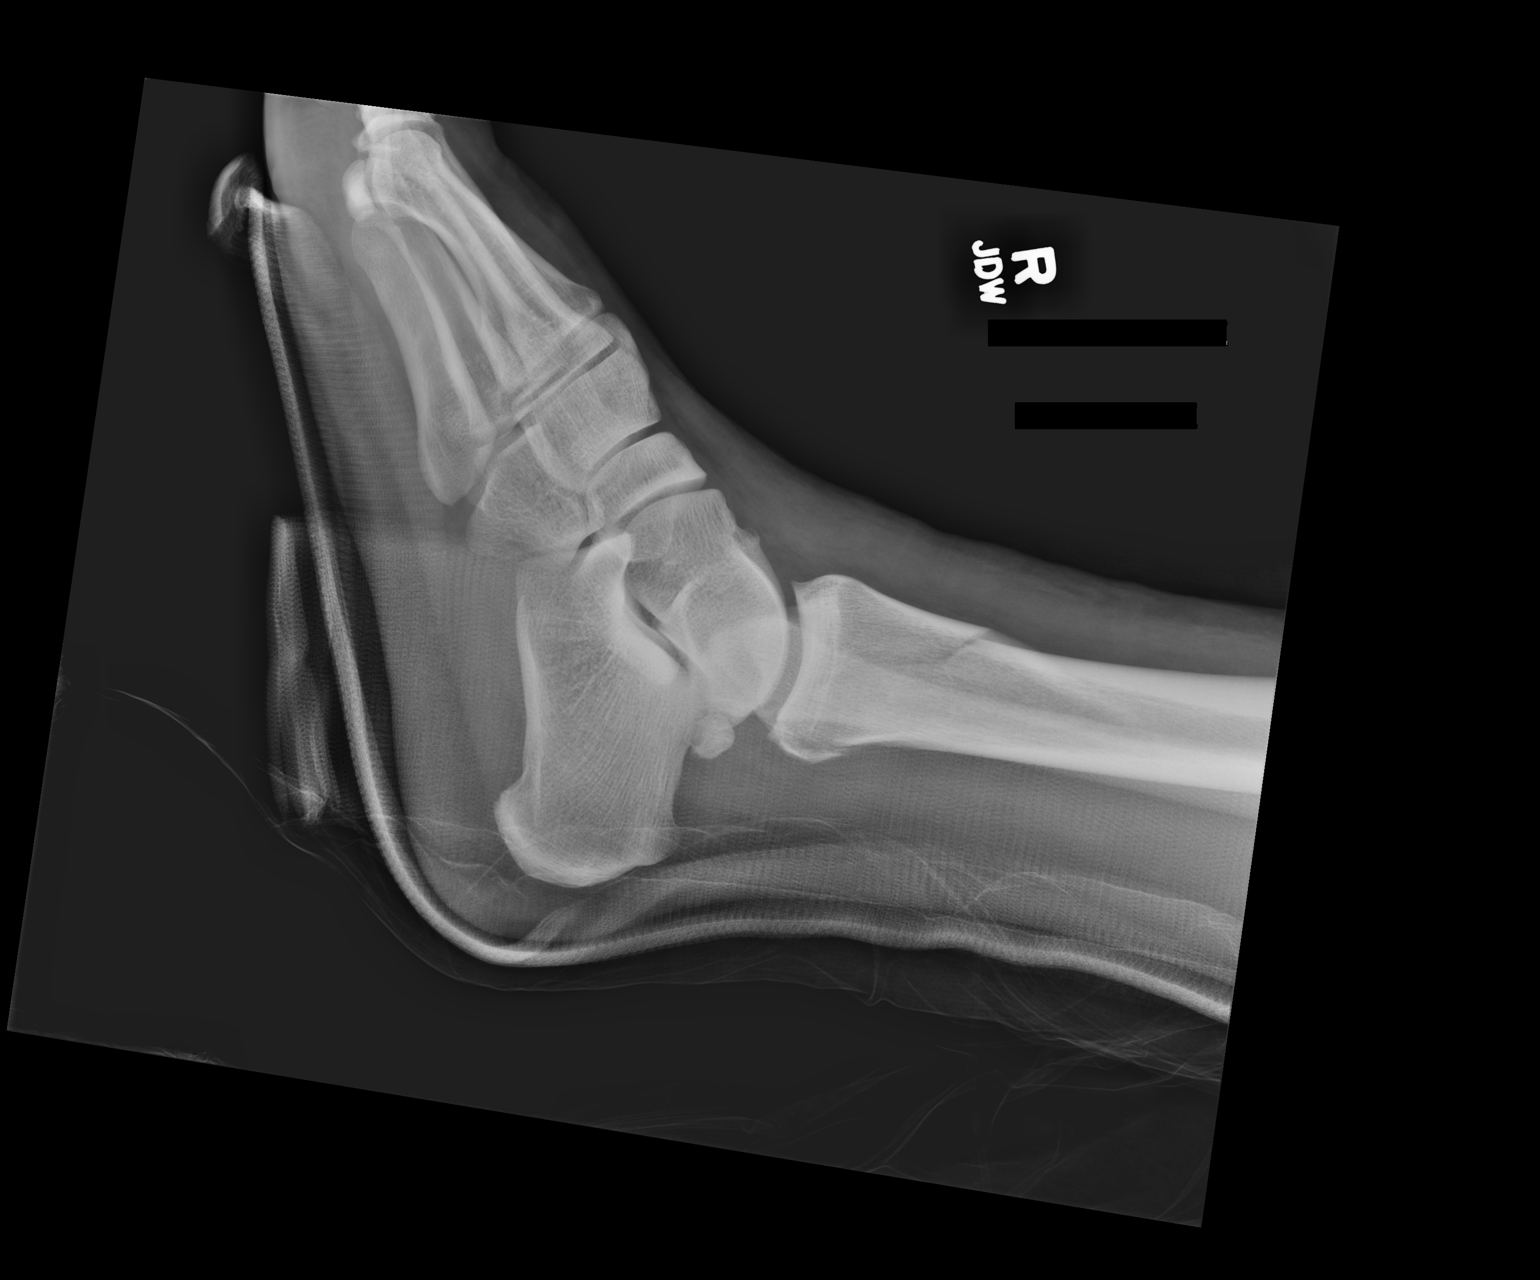

[3 of 3 positions shown; findings below may reference images not displayed]

FINDINGS: Oblique, comminuted, intra-articular fracture deformity involves the
distal fibula. The patient is status post closed reduction. The
fracture fragments are in near anatomic alignment. No complications.
IMPRESSION: 1. Status post close reduction of comminuted, intra-articular
fracture of the distal tibia.
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
## 2016-07-26 ENCOUNTER — Emergency Department (HOSPITAL_COMMUNITY)
Admission: EM | Admit: 2016-07-26 | Discharge: 2016-07-26 | Disposition: A | Discharge status: Home / Self Care | Payer: Self-pay | Attending: Emergency Medicine | Admitting: Emergency Medicine

## 2016-07-26 ENCOUNTER — Encounter (HOSPITAL_COMMUNITY): Payer: Self-pay | Admitting: Emergency Medicine

## 2016-07-26 DIAGNOSIS — F17210 Nicotine dependence, cigarettes, uncomplicated: Secondary | ICD-10-CM | POA: Insufficient documentation

## 2016-07-26 DIAGNOSIS — K0889 Other specified disorders of teeth and supporting structures: Secondary | ICD-10-CM | POA: Insufficient documentation

## 2016-07-26 MED ORDER — HYDROCODONE-ACETAMINOPHEN 5-325 MG PO TABS: 1.0000 | ORAL_TABLET | Freq: Four times a day (QID) | ORAL | 0 refills | Status: DC | PRN

## 2016-07-26 MED ORDER — PENICILLIN V POTASSIUM 500 MG PO TABS: 500.0000 mg | ORAL_TABLET | Freq: Four times a day (QID) | ORAL | 0 refills | Status: AC

## 2016-07-26 NOTE — ED Triage Notes (Signed)
Pt states for the last week he has had pain in the left side of his mouth and now pain is in right side of his upper mouth up to "his temple". Pt has been taking Tylenol with no relief.
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
## 2016-07-26 NOTE — Discharge Instructions (Signed)
Medications: Penicillin, Norco  Treatment: Take penicillin as prescribed for 1 week. Make sure to finish all this medication. Take Norco every 4-6 hours as needed for severe pain. Do not drive or operate machinery when taking this medication. Do not take Tylenol within 4 hours of this medication. You can take Tylenol or ibuprofen as prescribed over-the-counter as needed for mild to moderate pain.  Follow-up: Please follow-up with a dentist as soon as possible for further evaluation and treatment of your symptoms. Please return to emergency department if you develop any new or worsening symptoms.  In addition to the attached resources, you can also call the clinic below: [facility]  [employer]  [employer] [facility]  [address]  [place], [place] [address]  Phone [phone]

## 2016-08-05 ENCOUNTER — Encounter (HOSPITAL_COMMUNITY): Payer: Self-pay

## 2016-08-05 ENCOUNTER — Emergency Department (HOSPITAL_COMMUNITY)
Admission: EM | Admit: 2016-08-05 | Discharge: 2016-08-05 | Disposition: A | Discharge status: Home / Self Care | Payer: Self-pay | Attending: Emergency Medicine | Admitting: Emergency Medicine

## 2016-08-05 DIAGNOSIS — K029 Dental caries, unspecified: Secondary | ICD-10-CM | POA: Insufficient documentation

## 2016-08-05 DIAGNOSIS — Z79899 Other long term (current) drug therapy: Secondary | ICD-10-CM | POA: Insufficient documentation

## 2016-08-05 DIAGNOSIS — K0889 Other specified disorders of teeth and supporting structures: Secondary | ICD-10-CM

## 2016-08-05 DIAGNOSIS — F17210 Nicotine dependence, cigarettes, uncomplicated: Secondary | ICD-10-CM | POA: Insufficient documentation

## 2016-08-05 MED ORDER — NAPROXEN 500 MG PO TABS: 500.0000 mg | ORAL_TABLET | Freq: Two times a day (BID) | ORAL | 0 refills | Status: DC

## 2016-08-05 MED ORDER — CLINDAMYCIN HCL 150 MG PO CAPS: 300.0000 mg | ORAL_CAPSULE | Freq: Three times a day (TID) | ORAL | 0 refills | Status: DC

## 2016-08-05 NOTE — ED Triage Notes (Signed)
Pt reports bilateral pain in his wisdom teeth. Pt seen here and given PCN and Vicodin. Pt reports pain has continued to get worse and now causes him to have pain in his head.

## 2016-08-05 NOTE — ED Provider Notes (Signed)
[facility]EM[facility] DEPT Provider Note    By signing my name below, I, Earmon PhoenixJennifer Waddell, attest that this documentation has been prepared under the direction and in the presence of Will Alya Smaltz, PA-C. Electronically Signed: Earmon PhoenixJennifer Waddell, ED Scribe. 08/05/16. 11:48 AM.   History   Chief Complaint Chief Complaint  Patient presents with  . Dental Pain    The history is provided by the patient and medical records. No language interpreter was used.    HPI Comments:  [name] Hart is a 30 y.o. male with PMHx of polysubstance abuse who presents to the Emergency Department complaining of aching left and right lower dental pain that began two months ago. He reports associated bilateral otalgia. Pt was seen here 10 days ago with the same complaint and was referred to a dentist and treated with PCN and given an RX for Norco. He is requesting something stronger than Norco. He reports the pain is located around his wisdom teeth. He states he finished his course of PCN and is still experiencing the pain and requesting stronger antibiotics. He denies modifying factors. He denies fever, chills, nausea, vomiting, difficulty breathing or swallowing, drainage from the teeth, neck stiffness.   Past Medical History:  Diagnosis Date  . Fracture of distal end of tibia 11/22/2014  . Fracture of right proximal fibula 11/23/2014  . History of gonorrhea    treated august 2015  . Polysubstance abuse     Patient Active Problem List   Diagnosis Date Noted  . Fracture of right proximal fibula 11/23/2014  . Polysubstance abuse   . Fracture of distal end of tibia 11/22/2014    Past Surgical History:  Procedure Laterality Date  . CRPP left wrist     As a kid  . HARDWARE REMOVAL Right 04/28/2015   Procedure: HARDWARE REMOVAL RIGHT ANKLE, five screws 1 plate, and 1 washer removed;  Surgeon: Myrene GalasMichael Handy, MD;  [facility];  Service: Orthopedics;  Laterality: Right;  . ORIF ANKLE FRACTURE Right 12/04/2014   Procedure: RIGHT OPEN REDUCTION INTERNAL FIXATION (ORIF) PILON FRACTURE;  Surgeon: Myrene GalasMichael Handy, MD;  [facility];  Service: Orthopedics;  Laterality: Right;  ANESTHESIA: GENERAL, REGIONAL BLOCK       Home Medications    Prior to Admission medications   Medication Sig Start Date End Date Taking? Authorizing Provider  acetaminophen (TYLENOL) 500 MG tablet Take 500-1,000 mg by mouth every 6 (six) hours as needed for mild pain or headache.    Historical Provider, MD  clindamycin (CLEOCIN) 150 MG capsule Take 2 capsules (300 mg total) by mouth 3 (three) times daily. May dispense as 150mg  capsules 08/05/16   Everlene FarrierWilliam Traye Bates, PA-C  mupirocin ointment (BACTROBAN) 2 % Place into the nose 2 (two) times daily. 04/28/15   Montez MoritaKeith Paul, PA-C  naproxen (NAPROSYN) 500 MG tablet Take 1 tablet (500 mg total) by mouth 2 (two) times daily with a meal. 08/05/16   Everlene FarrierWilliam Meaghann Choo, PA-C  promethazine (PHENERGAN) 12.5 MG tablet Take 1-2 tablets (12.5-25 mg total) by mouth every 6 (six) hours as needed for nausea or vomiting. 04/28/15   Montez MoritaKeith Paul, PA-C    Family History Family History  Problem Relation Age of Onset  . Hypertension Mother   . Diabetes type II Mother   . Hypertension Father     Social History Social History  Substance Use Topics  . Smoking status: Current Every Day Smoker    Packs/day: 0.25    Years: 15.00    Types: Cigarettes  .  Smokeless tobacco: Never Used  . Alcohol use 0.0 oz/week     Comment: occasional     Allergies   Patient has no known allergies.   Review of Systems Review of Systems  Constitutional: Negative for chills and fever.  HENT: Positive for dental problem and ear pain. Negative for facial swelling, hearing loss, mouth sores, sore throat and trouble swallowing.   Eyes: Negative for visual disturbance.  Respiratory: Negative for shortness of breath.   Gastrointestinal: Negative for nausea and vomiting.  Musculoskeletal: Negative for neck pain.  Skin:  Negative for rash.  Neurological: Negative for headaches.     Physical Exam Updated Vital Signs BP 140/86 (BP Location: Left Arm)   Pulse (!) 52   Temp 98.3 F (36.8 C) (Oral)   Resp 16   SpO2 100%   Physical Exam  Constitutional: He appears well-developed and well-nourished. No distress.  Non-toxic appearing.   HENT:  Head: Normocephalic and atraumatic.  Right Ear: External ear normal.  Left Ear: External ear normal.  Mouth/Throat: Uvula is midline, oropharynx is clear and moist and mucous membranes are normal. No trismus in the jaw. Normal dentition. Dental caries present. No dental abscesses. No oropharyngeal exudate.  Tenderness to bilateral lower molars. No obvious dental infection or abscess. No trismus. No discharge from the mouth. No facial swelling. Uvula is midline without edema. Soft palate rises symmetrically. No tonsillar hypertrophy or exudates. Tongue protrusion is normal. Bilateral tympanic membranes are pearly-gray without erythema or loss of landmarks.   Eyes: Conjunctivae and EOM are normal. Pupils are equal, round, and reactive to light. Right eye exhibits no discharge. Left eye exhibits no discharge.  Neck: Normal range of motion. Neck supple. No JVD present. No tracheal deviation present.  Cardiovascular: Normal rate and intact distal pulses.   Pulmonary/Chest: Effort normal. No stridor. No respiratory distress.  Lymphadenopathy:    He has no cervical adenopathy.  Neurological: He is alert. No cranial nerve deficit. Coordination normal.  Skin: Skin is warm and dry. Capillary refill takes less than 2 seconds. No rash noted. He is not diaphoretic. No erythema. No pallor.  Psychiatric: He has a normal mood and affect. His behavior is normal.  Nursing note and vitals reviewed.    ED Treatments / Results  DIAGNOSTIC STUDIES: Oxygen Saturation is 100% on RA, normal by my interpretation.   COORDINATION OF CARE: 11:44 AM- Will give referral for dental care.  Extensively explained to pt that he will not receive resolution of his dental issues in the ED and he must follow up with a dentist. Will prescribe Naproxen and Clindamycin. Advised pt to take only OTC Tylenol with the Naproxen for continued pain. Pt verbalizes understanding and agrees to plan.  Medications - No data to display  Labs (all labs ordered are listed, but only abnormal results are displayed) Labs Reviewed - No data to display  EKG  EKG Interpretation None       Radiology No results found.  Procedures Procedures (including critical care time)  Medications Ordered in ED Medications - No data to display   Initial Impression / Assessment and Plan / ED Course  I have reviewed the triage vital signs and the nursing notes.  Pertinent labs & imaging results that were available during my care of the patient were reviewed by me and considered in my medical decision making (see chart for details).  Clinical Course     Patient with bilateral lower dentalgia that has been ongoing for the past two  months.  No abscess requiring immediate incision and drainage.  Exam not concerning for Ludwig's angina or pharyngeal abscess.  Will treat with Clindamycin and Naproxen. Pt instructed to follow-up with dentist. I provided dental resource guides.  Discussed return precautions. Pt safe for discharge. I advised the patient to follow-up with their primary care provider this week. I advised the patient to return to the emergency department with new or worsening symptoms or new concerns. The patient verbalized understanding and agreement with plan.    I personally performed the services described in this documentation, which was scribed in my presence. The recorded information has been reviewed and is accurate.     Final Clinical Impressions(s) / ED Diagnoses   Final diagnoses:  Pain, dental    New Prescriptions New Prescriptions   CLINDAMYCIN (CLEOCIN) 150 MG CAPSULE    Take 2  capsules (300 mg total) by mouth 3 (three) times daily. May dispense as 150mg  capsules   NAPROXEN (NAPROSYN) 500 MG TABLET    Take 1 tablet (500 mg total) by mouth 2 (two) times daily with a meal.     Everlene FarrierWilliam Zuzanna Maroney, PA-C 08/05/16 1155    Linwood DibblesJon Knapp, MD 08/06/16 1000

## 2016-08-11 ENCOUNTER — Encounter: Payer: Self-pay | Admitting: Physician Assistant

## 2016-08-11 ENCOUNTER — Ambulatory Visit: Payer: Self-pay | Attending: Internal Medicine | Admitting: Physician Assistant

## 2016-08-11 VITALS — BP 110/73 | HR 115 | Temp 98.6°F | Resp 16 | Wt 195.8 lb

## 2016-08-11 DIAGNOSIS — K029 Dental caries, unspecified: Secondary | ICD-10-CM | POA: Insufficient documentation

## 2016-08-11 DIAGNOSIS — Z79899 Other long term (current) drug therapy: Secondary | ICD-10-CM | POA: Insufficient documentation

## 2016-08-11 DIAGNOSIS — S82201D Unspecified fracture of shaft of right tibia, subsequent encounter for closed fracture with routine healing: Secondary | ICD-10-CM | POA: Insufficient documentation

## 2016-08-11 DIAGNOSIS — Z9889 Other specified postprocedural states: Secondary | ICD-10-CM | POA: Insufficient documentation

## 2016-08-11 MED ORDER — NAPROXEN 500 MG PO TABS: 500.0000 mg | ORAL_TABLET | Freq: Two times a day (BID) | ORAL | 0 refills | Status: DC

## 2016-08-11 NOTE — Progress Notes (Signed)
[facility] 30 [facility]  EAV:[id number]  [id number]  DOB - 03-28-1986  Subjective:  Chief Complaint and HPI: [name] Hart is a 30 y.o. male here today to establish care and for a follow up visit after being seen in the ED 07/26/2016 and 08/05/2016 for dental infection and dental pain.  He still has his wisdom teeth.  Back teeth lower jaw are painful B.  No f/c.  Seems to be improving after having a round of Penicillin and now being on Clindamycin.  He does not have a dentist.   PMH unremarkable other than surgery R Tib-fib fracture repair.  ED/Hospital notes reviewed.     ROS:   Constitutional:  No f/c, No night sweats, No unexplained weight loss. EENT:  No vision changes, No blurry vision, No hearing changes. No other mouth, throat, or ear problems.  Respiratory: No cough, No SOB Cardiac: No CP, no palpitations GI:  No abd pain, No N/V/D. GU: No Urinary s/sx Musculoskeletal: No joint pain Neuro: No headache, no dizziness, no motor weakness.  Skin: No rash Endocrine:  No polydipsia. No polyuria.  Psych: Denies SI/HI  No problems updated.  ALLERGIES: No Known Allergies  PAST MEDICAL HISTORY: Past Medical History:  Diagnosis Date  . Fracture of distal end of tibia 11/22/2014  . Fracture of right proximal fibula 11/23/2014  . History of gonorrhea    treated august 2015  . Polysubstance abuse     MEDICATIONS AT HOME: Prior to Admission medications   Medication Sig Start Date End Date Taking? Authorizing Provider  acetaminophen (TYLENOL) 500 MG tablet Take 500-1,000 mg by mouth every 6 (six) hours as needed for mild pain or headache.    Historical Provider, MD  clindamycin (CLEOCIN) 150 MG capsule Take 2 capsules (300 mg total) by mouth 3 (three) times daily. May dispense as 150mg  capsules 08/05/16   Everlene FarrierWilliam Dansie, PA-C  mupirocin ointment (BACTROBAN) 2 % Place into the nose 2 (two) times daily. 04/28/15   Montez MoritaKeith Paul, PA-C  naproxen (NAPROSYN) 500 MG  tablet Take 1 tablet (500 mg total) by mouth 2 (two) times daily with a meal. 08/11/16   Anders SimmondsAngela M Elvis Laufer, PA-C  promethazine (PHENERGAN) 12.5 MG tablet Take 1-2 tablets (12.5-25 mg total) by mouth every 6 (six) hours as needed for nausea or vomiting. 04/28/15   Montez MoritaKeith Paul, PA-C     Objective:  EXAM:   Vitals:   08/11/16 1638  BP: 110/73  Pulse: (!) 115  Resp: 16  Temp: 98.6 F (37 C)  TempSrc: Oral  SpO2: 96%  Weight: 195 lb 12.8 oz (88.8 kg)    General appearance : A&OX3. NAD. Non-toxic-appearing HEENT: Atraumatic and Normocephalic.  PERRLA. EOM intact.  TM clear B. Mouth-MMM, post pharynx WNL w/o erythema, No PND.  Poor dentition generally with a lot of plaque and tarter.  There are no obvious abscess.  The wisdom teeth of the lower jaw on the R, part of the tooth is impacted.  The L is all the way through the gums.  There is tenderness B around the gum lines, but no concern for abscess currently.  No submandibular LN. Neck: supple, no JVD. No cervical lymphadenopathy. No thyromegaly Chest/Lungs:  Breathing-non-labored, Good air entry bilaterally, breath sounds normal without rales, rhonchi, or wheezing  CVS: S1 S2 regular, no murmurs, gallops, rubs  Extremities: Bilateral Lower Ext shows no edema, both legs are warm to touch with = pulse throughout Neurology:  CN II-XII grossly intact, Non focal.  Psych:  TP linear. J/I WNL. Normal speech. Appropriate eye contact and affect.  Skin:  No Rash  Data Review No results found for: HGBA1C   Assessment & Plan   1. Pain due to dental caries - Ambulatory referral to Dentistry - naproxen (NAPROSYN) 500 MG tablet; Take 1 tablet (500 mg total) by mouth 2 (two) times daily with a meal.  Dispense: 60 tablet; Refill: 0  Patient have been counseled extensively about nutrition and exercise  Return in about 6 weeks (around 09/22/2016) for establish care/CPE with fasting bloodwork.  The patient was given clear instructions to go to ER or  return to medical center if symptoms don't improve, worsen or new problems develop. The patient verbalized understanding. The patient was told to call to get lab results if they haven't heard anything in the next week.     Georgian CoAngela Kirin Pastorino, PA-C [facility] [place], [place] [phone]   08/11/2016, 4:47 PM

## 2016-09-19 ENCOUNTER — Emergency Department (HOSPITAL_COMMUNITY): Admission: EM | Admit: 2016-09-19 | Discharge: 2016-09-19 | Discharge status: Against Medical Advice | Payer: Self-pay

## 2016-09-19 NOTE — ED Notes (Signed)
Name called for triage - no answer x1

## 2016-09-19 NOTE — ED Notes (Signed)
Name called for triage- no answer 

## 2016-09-27 ENCOUNTER — Ambulatory Visit: Payer: Self-pay | Attending: Internal Medicine

## 2016-10-28 ENCOUNTER — Emergency Department (HOSPITAL_COMMUNITY)
Admission: EM | Admit: 2016-10-28 | Discharge: 2016-10-28 | Disposition: A | Discharge status: Home / Self Care | Payer: Self-pay | Attending: Emergency Medicine | Admitting: Emergency Medicine

## 2016-10-28 ENCOUNTER — Encounter (HOSPITAL_COMMUNITY): Payer: Self-pay | Admitting: Emergency Medicine

## 2016-10-28 DIAGNOSIS — K029 Dental caries, unspecified: Secondary | ICD-10-CM | POA: Insufficient documentation

## 2016-10-28 DIAGNOSIS — K047 Periapical abscess without sinus: Secondary | ICD-10-CM

## 2016-10-28 DIAGNOSIS — F17210 Nicotine dependence, cigarettes, uncomplicated: Secondary | ICD-10-CM | POA: Insufficient documentation

## 2016-10-28 MED ORDER — NAPROXEN 500 MG PO TABS: 500.0000 mg | ORAL_TABLET | Freq: Two times a day (BID) | ORAL | 0 refills | Status: DC

## 2016-10-28 MED ORDER — IBUPROFEN 800 MG PO TABS
800.0000 mg | ORAL_TABLET | Freq: Once | ORAL | Status: AC
  Administered 2016-10-28: 800 mg via ORAL
  Filled 2016-10-28: qty 1

## 2016-10-28 MED ORDER — AMOXICILLIN 500 MG PO CAPS: 500.0000 mg | ORAL_CAPSULE | Freq: Three times a day (TID) | ORAL | 0 refills | Status: DC

## 2016-10-28 MED ORDER — AMOXICILLIN 500 MG PO CAPS
500.0000 mg | ORAL_CAPSULE | Freq: Once | ORAL | Status: AC
  Administered 2016-10-28: 500 mg via ORAL
  Filled 2016-10-28: qty 1

## 2016-10-28 NOTE — Discharge Instructions (Signed)
Go to the dentist that you had the referral for on your last visit. Take the medications as directed.

## 2016-10-28 NOTE — ED Triage Notes (Signed)
Pt states he cracked bilateral bottom wisdom teeth awhile back.  Reports increased pain the last 2 days.

## 2016-10-28 NOTE — ED Provider Notes (Signed)
[facility] Provider Note   CSN: [id number] Arrival date & time: 10/28/16  1955   By signing my name below, I, [name] Hart, attest that this documentation has been prepared under the direction and in the presence of [name], NP Electronically Signed: Soijett Hart, ED Scribe. 10/28/16. 11:24 PM.  History   Chief Complaint Chief Complaint  Patient presents with  . Dental Pain    HPI [name] Hart is a 31 y.o. male with a PMHx of polysubstance abuse, who presents to the Emergency Department complaining of bilateral lower back dental pain onset 3 months worsening 2 days ago. Pt reports associated right ear pain and HA. Pt hasn't tried any medications for the relief of his symptoms. Pt notes that he has been evaluated three times in November 2017 for his dental pain. Pt reports that he didn't follow up with the dentist, due to there being a decrease to his dental pain. He denies fever, chills, and any other symptoms. Pt denies having insurance at this time.  The history is provided by the patient. No language interpreter was used.    Past Medical History:  Diagnosis Date  . Fracture of distal end of tibia 11/22/2014  . Fracture of right proximal fibula 11/23/2014  . History of gonorrhea    treated august 2015  . Polysubstance abuse     Patient Active Problem List   Diagnosis Date Noted  . Fracture of right proximal fibula 11/23/2014  . Polysubstance abuse   . Fracture of distal end of tibia 11/22/2014    Past Surgical History:  Procedure Laterality Date  . CRPP left wrist     As a kid  . HARDWARE REMOVAL Right 04/28/2015   Procedure: HARDWARE REMOVAL RIGHT ANKLE, five screws 1 plate, and 1 washer removed;  Surgeon: Myrene GalasMichael Handy, MD;  [facility];  Service: Orthopedics;  Laterality: Right;  . ORIF ANKLE FRACTURE Right 12/04/2014   Procedure: RIGHT OPEN REDUCTION INTERNAL FIXATION (ORIF) PILON FRACTURE;  Surgeon: Myrene GalasMichael Handy, MD;  [facility];  Service:  Orthopedics;  Laterality: Right;  ANESTHESIA: GENERAL, REGIONAL BLOCK       Home Medications    Prior to Admission medications   Medication Sig Start Date End Date Taking? Authorizing Provider  acetaminophen (TYLENOL) 500 MG tablet Take 500-1,000 mg by mouth every 6 (six) hours as needed for mild pain or headache.    Historical Provider, MD  amoxicillin (AMOXIL) 500 MG capsule Take 1 capsule (500 mg total) by mouth 3 (three) times daily. 10/28/16   Hope Orlene OchM Neese, NP  mupirocin ointment (BACTROBAN) 2 % Place into the nose 2 (two) times daily. 04/28/15   Montez MoritaKeith Paul, PA-C  naproxen (NAPROSYN) 500 MG tablet Take 1 tablet (500 mg total) by mouth 2 (two) times daily. 10/28/16   Hope Orlene OchM Neese, NP  promethazine (PHENERGAN) 12.5 MG tablet Take 1-2 tablets (12.5-25 mg total) by mouth every 6 (six) hours as needed for nausea or vomiting. 04/28/15   Montez MoritaKeith Paul, PA-C    Family History Family History  Problem Relation Age of Onset  . Hypertension Mother   . Diabetes type II Mother   . Hypertension Father     Social History Social History  Substance Use Topics  . Smoking status: Current Every Day Smoker    Packs/day: 0.25    Years: 15.00    Types: Cigarettes  . Smokeless tobacco: Never Used  . Alcohol use 0.0 oz/week     Comment: occasional  Allergies   Patient has no known allergies.   Review of Systems Review of Systems  Constitutional: Negative for chills and fever.  HENT: Positive for dental problem (bilateral lower back) and ear pain (right). Negative for tinnitus.   Gastrointestinal: Negative for nausea and vomiting.  Neurological: Positive for headaches.     Physical Exam Updated Vital Signs BP 131/60 (BP Location: Right Arm)   Pulse 60   Temp 98.8 F (37.1 C) (Oral)   Resp 16   SpO2 100%   Physical Exam  Constitutional: He is oriented to person, place, and time. He appears well-developed and well-nourished. No distress.  HENT:  Head: Normocephalic and atraumatic.    Right Ear: Tympanic membrane and ear canal normal.  Left Ear: Tympanic membrane and ear canal normal.  Mouth/Throat: Uvula is midline, oropharynx is clear and moist and mucous membranes are normal. Abnormal dentition. No posterior oropharyngeal edema or posterior oropharyngeal erythema.  Second molar, right lower is broken and decayed.  Swelling of surrounding gum. Third molar is partially erupted with swelling and erythema of surrounding gums.  Eyes: Conjunctivae and EOM are normal.  Neck: Neck supple.  Slightly enlarged anterior cervical nodes.   Cardiovascular: Normal rate and regular rhythm.   Pulmonary/Chest: Effort normal and breath sounds normal.  Abdominal: Soft. Bowel sounds are normal. There is no tenderness.  Musculoskeletal: Normal range of motion.  Lymphadenopathy:    He has cervical adenopathy.  Neurological: He is alert and oriented to person, place, and time.  Skin: Skin is warm and dry.  Psychiatric: He has a normal mood and affect. His behavior is normal.  Nursing note and vitals reviewed.    ED Treatments / Results  DIAGNOSTIC STUDIES: Oxygen Saturation is 100% on RA, nl by my interpretation.    COORDINATION OF CARE: 11:18 PM Discussed treatment plan with pt at bedside which includes amoxil Rx, ibuprofen Rx, and pt agreed to plan.  Procedures Procedures (including critical care time)  Medications Ordered in ED Medications  amoxicillin (AMOXIL) capsule 500 mg (500 mg Oral Given 10/28/16 2333)  ibuprofen (ADVIL,MOTRIN) tablet 800 mg (800 mg Oral Given 10/28/16 2333)     Initial Impression / Assessment and Plan / ED Course  I have reviewed the triage vital signs and the nursing notes.  Patient with dentalgia.  No abscess requiring immediate incision and drainage.  Exam not concerning for Ludwig's angina or pharyngeal abscess.  Will treat with amoxil and ibuprofen Rx. Pt instructed to follow-up with dentist.  Discussed return precautions. Pt safe for  discharge.  Final Clinical Impressions(s) / ED Diagnoses   Final diagnoses:  Infected dental caries    New Prescriptions Discharge Medication List as of 10/28/2016 11:22 PM    START taking these medications   Details  amoxicillin (AMOXIL) 500 MG capsule Take 1 capsule (500 mg total) by mouth 3 (three) times daily., Starting Fri 10/28/2016, Print      I personally performed the services described in this documentation, which was scribed in my presence. The recorded information has been reviewed and is accurate.     [address] [place], [place] 10/30/16 1610    Lorre Nick, MD 10/30/16 [phone]

## 2016-12-06 ENCOUNTER — Ambulatory Visit: Payer: Self-pay | Attending: Internal Medicine

## 2016-12-20 ENCOUNTER — Encounter (HOSPITAL_COMMUNITY): Payer: Self-pay

## 2016-12-20 ENCOUNTER — Emergency Department (HOSPITAL_COMMUNITY)
Admission: EM | Admit: 2016-12-20 | Discharge: 2016-12-21 | Disposition: A | Discharge status: Home / Self Care | Payer: Self-pay | Attending: Emergency Medicine | Admitting: Emergency Medicine

## 2016-12-20 DIAGNOSIS — K0520 Aggressive periodontitis, unspecified: Secondary | ICD-10-CM | POA: Insufficient documentation

## 2016-12-20 DIAGNOSIS — F17210 Nicotine dependence, cigarettes, uncomplicated: Secondary | ICD-10-CM | POA: Insufficient documentation

## 2016-12-20 MED ORDER — OXYCODONE-ACETAMINOPHEN 5-325 MG PO TABS
1.0000 | ORAL_TABLET | Freq: Once | ORAL | Status: AC
  Administered 2016-12-21: 1 via ORAL
  Filled 2016-12-20: qty 1

## 2016-12-20 MED ORDER — CLINDAMYCIN HCL 150 MG PO CAPS
300.0000 mg | ORAL_CAPSULE | Freq: Once | ORAL | Status: AC
  Administered 2016-12-21: 300 mg via ORAL
  Filled 2016-12-20: qty 2

## 2016-12-20 NOTE — ED Provider Notes (Signed)
[facility]-EMERGENCY DEPT Provider Note   CSN: [id number] Arrival date & time: 12/20/16  2228  By signing my name below, I, Teofilo Pod, attest that this documentation has been prepared under the direction and in the presence of Kerrie Buffalo, NP. Electronically Signed: Teofilo Pod, ED Scribe. 12/20/2016. 11:55 PM.    History   Chief Complaint Chief Complaint  Patient presents with  . Dental Pain    The history is provided by the patient. No language interpreter was used.   HPI Comments:  [name] is a 31 y.o. male who presents to the Emergency Department complaining of constant dental pain on the left and right side for 2 months. Pt notes that both of his lower wisdom teeth are broken. Pt complains of associated headache. Pt does not have a dentist at this time. Pt is taking prescribed amoxicillin and naproxen with no relief. He notes that he has only been taking the amoxicillin with pain and not 3 times a day as prescribed. Denies fever, chills.   Past Medical History:  Diagnosis Date  . Fracture of distal end of tibia 11/22/2014  . Fracture of right proximal fibula 11/23/2014  . History of gonorrhea    treated august 2015  . Polysubstance abuse     Patient Active Problem List   Diagnosis Date Noted  . Fracture of right proximal fibula 11/23/2014  . Polysubstance abuse   . Fracture of distal end of tibia 11/22/2014    Past Surgical History:  Procedure Laterality Date  . CRPP left wrist     As a kid  . HARDWARE REMOVAL Right 04/28/2015   Procedure: HARDWARE REMOVAL RIGHT ANKLE, five screws 1 plate, and 1 washer removed;  Surgeon: Myrene Galas, MD;  Location: [facility] OR;  Service: Orthopedics;  Laterality: Right;  . ORIF ANKLE FRACTURE Right 12/04/2014   Procedure: RIGHT OPEN REDUCTION INTERNAL FIXATION (ORIF) PILON FRACTURE;  Surgeon: Myrene Galas, MD;  Location: [facility] OR;  Service: Orthopedics;  Laterality: Right;  ANESTHESIA: GENERAL, REGIONAL BLOCK       Home  Medications    Prior to Admission medications   Medication Sig Start Date End Date Taking? Authorizing Provider  acetaminophen (TYLENOL) 500 MG tablet Take 500-1,000 mg by mouth every 6 (six) hours as needed for mild pain or headache.    Historical Provider, MD  amoxicillin (AMOXIL) 500 MG capsule Take 1 capsule (500 mg total) by mouth 3 (three) times daily. 10/28/16   Dezmen Alcock Orlene Och, NP  clindamycin (CLEOCIN) 300 MG capsule Take 1 capsule (300 mg total) by mouth 3 (three) times daily. 12/21/16   Omair Dettmer Orlene Och, NP  mupirocin ointment (BACTROBAN) 2 % Place into the nose 2 (two) times daily. 04/28/15   Montez Morita, PA-C  naproxen (NAPROSYN) 500 MG tablet Take 1 tablet (500 mg total) by mouth 2 (two) times daily. 12/20/16   Sashay Felling Orlene Och, NP  promethazine (PHENERGAN) 12.5 MG tablet Take 1-2 tablets (12.5-25 mg total) by mouth every 6 (six) hours as needed for nausea or vomiting. 04/28/15   Montez Morita, PA-C    Family History Family History  Problem Relation Age of Onset  . Hypertension Mother   . Diabetes type II Mother   . Hypertension Father     Social History Social History  Substance Use Topics  . Smoking status: Current Every Day Smoker    Packs/day: 0.25    Years: 15.00    Types: Cigarettes  . Smokeless tobacco: Never Used  . Alcohol  use 0.0 oz/week     Comment: occasional     Allergies   Patient has no known allergies.   Review of Systems Review of Systems  Constitutional: Negative for chills and fever.  HENT: Positive for dental problem.   Neurological: Positive for headaches.     Physical Exam Updated Vital Signs BP 133/83   Pulse 60   Temp 98.5 F (36.9 C)   Resp 18   Ht  (1.727 m)   Wt 81.6 kg   SpO2 99%   BMI 27.37 kg/m   Physical Exam  Constitutional: He appears well-developed and well-nourished. No distress.  HENT:  Head: Normocephalic and atraumatic.  Gums surrounding bilateral lower molars erythematous, tender, and swollen.   Eyes: Conjunctivae  are normal.  Cardiovascular: Normal rate.   Pulmonary/Chest: Effort normal.  Abdominal: He exhibits no distension.  Lymphadenopathy:    He has cervical adenopathy (anterior).  Neurological: He is alert.  Skin: Skin is warm and dry.  Psychiatric: He has a normal mood and affect.  Nursing note and vitals reviewed.    ED Treatments / Results  DIAGNOSTIC STUDIES:  Oxygen Saturation is 99% on RA, normal by my interpretation.    COORDINATION OF CARE:  11:54 PM Discussed treatment plan with pt at bedside and pt agreed to plan.   Labs (all labs ordered are listed, but only abnormal results are displayed) Labs Reviewed - No data to display   Radiology No results found.  Procedures Procedures (including critical care time)  Medications Ordered in ED Medications  oxyCODONE-acetaminophen (PERCOCET/ROXICET) 5-325 MG per tablet 1 tablet (not administered)  clindamycin (CLEOCIN) capsule 300 mg (not administered)     Initial Impression / Assessment and Plan / ED Course  I have reviewed the triage vital signs and the nursing notes.   Final Clinical Impressions(s) / ED Diagnoses  31 y.o. male with dental pain that has been off and on for the past month stable for d/c without abscess that requires immediate I&D and no difficulty swallowing. No signs of Ludwig's angina. Will treat with antibiotics and NSAIDS. Referral to dental clinic given.   Final diagnoses:  Acute pericoronitis    New Prescriptions New Prescriptions   CLINDAMYCIN (CLEOCIN) 300 MG CAPSULE    Take 1 capsule (300 mg total) by mouth 3 (three) times daily.   NAPROXEN (NAPROSYN) 500 MG TABLET    Take 1 tablet (500 mg total) by mouth 2 (two) times daily.   I personally performed the services described in this documentation, which was scribed in my presence. The recorded information has been reviewed and is accurate.     [address] [place], NP 12/21/16 0025    Marily Memos, MD 12/21/16 2006

## 2016-12-20 NOTE — ED Triage Notes (Signed)
Pt presents to the ed with complaints of cracked wisdom teeth on each side of his mouth.  States he does not have a dentist and needs something to help with the pain

## 2016-12-21 MED ORDER — CLINDAMYCIN HCL 300 MG PO CAPS: 300.0000 mg | ORAL_CAPSULE | Freq: Three times a day (TID) | ORAL | 0 refills | Status: DC

## 2016-12-21 MED ORDER — NAPROXEN 500 MG PO TABS: 500.0000 mg | ORAL_TABLET | Freq: Two times a day (BID) | ORAL | 0 refills | Status: DC

## 2016-12-21 NOTE — ED Notes (Signed)
Pt stable, understands discharge instructions, and reasons for return.   

## 2016-12-21 NOTE — ED Notes (Signed)
Pt c/o of bilateral wisdom teeth pain going on for several weeks. Pt currently on antibiotics. See providers assessment.

## 2016-12-21 NOTE — Discharge Instructions (Signed)
Be sure to take the antibiotic as directed.

## 2016-12-26 ENCOUNTER — Encounter: Payer: Self-pay | Admitting: Physician Assistant

## 2016-12-26 ENCOUNTER — Ambulatory Visit: Payer: Self-pay | Attending: Physician Assistant | Admitting: Physician Assistant

## 2016-12-26 VITALS — BP 130/85 | HR 60 | Temp 98.3°F | Resp 16 | Ht 66.0 in | Wt 189.6 lb

## 2016-12-26 DIAGNOSIS — K0520 Aggressive periodontitis, unspecified: Secondary | ICD-10-CM | POA: Insufficient documentation

## 2016-12-26 MED ORDER — TRAMADOL HCL 50 MG PO TABS: 50.0000 mg | ORAL_TABLET | Freq: Three times a day (TID) | ORAL | 0 refills | Status: DC | PRN

## 2016-12-26 NOTE — Patient Instructions (Signed)
Dental Pain Dental pain may be caused by many things, including:  Tooth decay (cavities or caries). Cavities expose the nerve of your tooth to air and hot or cold temperatures. This can cause pain or discomfort.  Abscess or infection. A dental abscess is a collection of infected pus from a bacterial infection in the inner part of the tooth (pulp). It usually occurs at the end of the tooth's root.  Injury.  An unknown reason (idiopathic). Your pain may be mild or severe. It may only occur when:  You are chewing.  You are exposed to hot or cold temperature.  You are eating or drinking sugary foods or beverages, such as soda or candy. Your pain may also be constant. Follow these instructions at home: Watch your dental pain for any changes. The following actions may help to lessen any discomfort that you are feeling:  Take medicines only as directed by your dentist.  If you were prescribed an antibiotic medicine, finish all of it even if you start to feel better.  Keep all follow-up visits as directed by your dentist. This is important.  Do not apply heat to the outside of your face.  Rinse your mouth or gargle with salt water if directed by your dentist. This helps with pain and swelling.  You can make salt water by adding  tsp of salt to 1 cup of warm water.  Apply ice to the painful area of your face:  Put ice in a plastic bag.  Place a towel between your skin and the bag.  Leave the ice on for 20 minutes, 2-3 times per day.  Avoid foods or drinks that cause you pain, such as:  Very hot or very cold foods or drinks.  Sweet or sugary foods or drinks. Contact a health care provider if:  Your pain is not controlled with medicines.  Your symptoms are worse.  You have new symptoms. Get help right away if:  You are unable to open your mouth.  You are having trouble breathing or swallowing.  You have a fever.  Your face, neck, or jaw is swollen. This information  is not intended to replace advice given to you by your health care provider. Make sure you discuss any questions you have with your health care provider. Document Released: 08/29/2005 Document Revised: 01/07/2016 Document Reviewed: 08/25/2014 Elsevier Interactive Patient Education  2017 Elsevier Inc[employer]   

## 2016-12-26 NOTE — Progress Notes (Signed)
Subjective:  Patient ID: [name], male    DOB: 1986/08/19  Age: 31 y.o. MRN: [id number]  CC: No chief complaint on file.   HPI Momodou Consiglio is a 31 y.o. male with no pertinent PMH presents with tooth pain. He was seen at the ED. ED visit on 12/20/16, diagnosed with acute pericoronitis, rx'ed Clindamycin  TID and Naproxen 500 mg BID. Has been taking medications as directed and has noticed a reduction in the swelling of his face/jaw. However, the pain is severe at times, 9/10 and will need a dental referral. Denies tongue pain, floor of mouth pain, drooling, change in speech, swelling of the neck, trouble swallowing, f/c/n/v, rash, CP, or SOB.    Review of Systems  Constitutional: Negative for chills, fever and malaise/fatigue.  HENT: Negative for ear pain.        Multiple tooth pain  Eyes: Negative for blurred vision.  Respiratory: Negative for shortness of breath.   Cardiovascular: Negative for chest pain and palpitations.  Gastrointestinal: Negative for abdominal pain and nausea.  Genitourinary: Negative for dysuria and hematuria.  Musculoskeletal: Negative for joint pain and myalgias.  Skin: Negative for rash.  Neurological: Negative for tingling and headaches.  Psychiatric/Behavioral: Negative for depression. The patient is not nervous/anxious.     Objective:  BP 130/85 (BP Location: Right Arm, Patient Position: Sitting, Cuff Size: Normal)   Pulse 60   Temp 98.3 F (36.8 C) (Oral)   Resp 16   Ht  (1.676 m)   Wt 189 lb 9.6 oz (86 kg)   SpO2 100%   BMI 30.60 kg/m   BP/Weight 12/26/2016 12/21/2016 12/20/2016  Systolic BP 130 126 -  Diastolic BP 85 78 -  Wt. (Lbs) 189.6 - 180  BMI 30.6 - 27.37      Physical Exam  Constitutional: He is oriented to person, place, and time.  Well developed, well nourished, in discomfort 2/2 tooth pain, polite. Speech clear  HENT:  Head: Normocephalic and atraumatic.  Mild to moderate right sided facial swelling in  proximity to the angle of the mandible. No overlying erythema or ecchymosis. Uvula midline. Tonsils 1+.  Eyes: No scleral icterus.  Neck: Normal range of motion. Neck supple. No thyromegaly present.  No generalized swelling of the neck  Cardiovascular: Normal rate, regular rhythm and normal heart sounds.   Pulmonary/Chest: Effort normal and breath sounds normal.  Musculoskeletal: He exhibits no edema.  Lymphadenopathy:    He has no cervical adenopathy.  Neurological: He is alert and oriented to person, place, and time.  Skin: Skin is warm and dry. No rash noted. No erythema. No pallor.  Psychiatric: His behavior is normal. Thought content normal.  Vitals reviewed.    Assessment & Plan:   1. Acute pericoronitis - Urgent ambulatory referral to Dentistry - traMADol (ULTRAM) 50 MG tablet; Take 1 tablet (50 mg total) by mouth every 8 (eight) hours as needed.  Dispense: 21 tablet; Refill: 0 - Continue on Clindamycin and Naproxen  Meds ordered this encounter  Medications  . traMADol (ULTRAM) 50 MG tablet    Sig: Take 1 tablet (50 mg total) by mouth every 8 (eight) hours as needed.    Dispense:  21 tablet    Refill:  0    Order Specific Question:   Supervising Provider    Answer:   Quentin Angst [id number]    Follow-up: Return if symptoms worsen or fail to improve.   Loletta Specter PA

## 2017-11-02 ENCOUNTER — Other Ambulatory Visit: Payer: Self-pay

## 2017-11-02 ENCOUNTER — Emergency Department (HOSPITAL_COMMUNITY)
Admission: EM | Admit: 2017-11-02 | Discharge: 2017-11-02 | Disposition: A | Discharge status: Home / Self Care | Payer: Self-pay | Attending: Emergency Medicine | Admitting: Emergency Medicine

## 2017-11-02 ENCOUNTER — Encounter (HOSPITAL_COMMUNITY): Payer: Self-pay | Admitting: *Deleted

## 2017-11-02 DIAGNOSIS — F17210 Nicotine dependence, cigarettes, uncomplicated: Secondary | ICD-10-CM | POA: Insufficient documentation

## 2017-11-02 DIAGNOSIS — K0889 Other specified disorders of teeth and supporting structures: Secondary | ICD-10-CM | POA: Insufficient documentation

## 2017-11-02 MED ORDER — LIDOCAINE VISCOUS 2 % MT SOLN: 15.0000 mL | OROMUCOSAL | 0 refills | Status: DC | PRN

## 2017-11-02 MED ORDER — NAPROXEN 500 MG PO TABS: 500.0000 mg | ORAL_TABLET | Freq: Two times a day (BID) | ORAL | 0 refills | Status: DC

## 2017-11-02 MED ORDER — PENICILLIN V POTASSIUM 500 MG PO TABS: 500.0000 mg | ORAL_TABLET | Freq: Four times a day (QID) | ORAL | 0 refills | Status: AC

## 2017-11-02 NOTE — Discharge Instructions (Signed)
Please read and follow all provided instructions.  Your diagnoses today include:  1. Pain, dental     The exam and treatment you received today has been provided on an emergency basis only. This is not a substitute for complete medical or dental care. This problem will not resolve on its own without the care of a dentist.  Tests performed today include: Vital signs. See below for your results today.   Medications prescribed:   Take any prescribed medications only as directed. Use ibuprofen or naproxen for pain. Use the viscous lidocaine for mouth pain. Swish with the lidocaine and spit it out. Do not swallow it.  Please take all of your antibiotics until finished!   You may develop abdominal discomfort or diarrhea from the antibiotic.  You may help offset this with probiotics which you can buy or get in yogurt. Do not eat or take the probiotics until 2 hours after your antibiotic. Do not take your medicine if develop an itchy rash, swelling in your mouth or lips, or difficulty breathing.   Home care instructions:  Follow any educational materials contained in this packet.  Follow-up instructions: Please follow-up with your dentist for further evaluation of your symptoms.   Dental Assistance: See handout for dental referrals  Return instructions:  Please return to the Emergency Department if you experience worsening symptoms. Please return if you develop a fever, you develop more swelling in your face or neck, you have trouble breathing or swallowing food. Please return if you have any other emergent concerns.  Additional Information:  Your vital signs today were: BP 124/85 (BP Location: Right Arm)    Pulse 66    Temp 98.4 F (36.9 C) (Oral)    Resp 18    SpO2 100%  If your blood pressure (BP) was elevated above 135/85 this visit, please have this repeated by your doctor within one month. --------------

## 2017-11-02 NOTE — ED Notes (Signed)
Declined W/C at D/C and was escorted to lobby by RN. 

## 2017-11-02 NOTE — ED Provider Notes (Signed)
[name] Provider Note   CSN: [id number] Arrival date & time: 11/02/17  4098     History   Chief Complaint Chief Complaint  Patient presents with  . Dental Pain    HPI [name] is a 32 y.o. male who presents the emergency department today for dental pain.  Patient states that he has been dealing with a cracked right lower and left lower tooth for greater than 1 year.  He had a dentist appointment plan to have these removed but due to the passing of a family member he missed the appointment.  He has recently moved back to the area and is requesting dental resources.  He states that his pain is currently well controlled with over-the-counter medications.  Nothing makes his symptoms worse. Denies fever, chills, voice change, inability to control secretions, nausea/vomiting, facial swelling, dysphagia, odynophagia, drainage or trauma.   HPI  Past Medical History:  Diagnosis Date  . Fracture of distal end of tibia 11/22/2014  . Fracture of right proximal fibula 11/23/2014  . History of gonorrhea    treated august 2015  . Polysubstance abuse [facility])     Patient Active Problem List   Diagnosis Date Noted  . Acute pericoronitis 12/26/2016  . Fracture of right proximal fibula 11/23/2014  . Polysubstance abuse (HCC)   . Fracture of distal end of tibia 11/22/2014    Past Surgical History:  Procedure Laterality Date  . CRPP left wrist     As a kid  . HARDWARE REMOVAL Right 04/28/2015   Procedure: HARDWARE REMOVAL RIGHT ANKLE, five screws 1 plate, and 1 washer removed;  Surgeon: Myrene Galas, MD;  Location: [facility] OR;  Service: Orthopedics;  Laterality: Right;  . ORIF ANKLE FRACTURE Right 12/04/2014   Procedure: RIGHT OPEN REDUCTION INTERNAL FIXATION (ORIF) PILON FRACTURE;  Surgeon: Myrene Galas, MD;  Location: [facility] OR;  Service: Orthopedics;  Laterality: Right;  ANESTHESIA: GENERAL, REGIONAL BLOCK       Home Medications    Prior to  Admission medications   Medication Sig Start Date End Date Taking? Authorizing Provider  acetaminophen (TYLENOL) 500 MG tablet Take 500-1,000 mg by mouth every 6 (six) hours as needed for mild pain or headache.    [provider]  amoxicillin (AMOXIL) 500 MG capsule Take 1 capsule (500 mg total) by mouth 3 (three) times daily. 10/28/16   Janne Napoleon, NP  clindamycin (CLEOCIN) 300 MG capsule Take 1 capsule (300 mg total) by mouth 3 (three) times daily. 12/21/16   Janne Napoleon, NP  mupirocin ointment (BACTROBAN) 2 % Place into the nose 2 (two) times daily. 04/28/15   Montez Morita, PA-C  naproxen (NAPROSYN) 500 MG tablet Take 1 tablet (500 mg total) by mouth 2 (two) times daily. 12/20/16   Janne Napoleon, NP  promethazine (PHENERGAN) 12.5 MG tablet Take 1-2 tablets (12.5-25 mg total) by mouth every 6 (six) hours as needed for nausea or vomiting. 04/28/15   Montez Morita, PA-C  traMADol (ULTRAM) 50 MG tablet Take 1 tablet (50 mg total) by mouth every 8 (eight) hours as needed. 12/26/16   Loletta Specter, PA-C    Family History Family History  Problem Relation Age of Onset  . Hypertension Mother   . Diabetes type II Mother   . Hypertension Father     Social History Social History   Tobacco Use  . Smoking status: Current Every Day Smoker    Packs/day: 0.25    Years: 15.00  Pack years: 3.75    Types: Cigarettes  . Smokeless tobacco: Never Used  Substance Use Topics  . Alcohol use: Yes    Alcohol/week: 0.0 oz    Comment: occasional  . Drug use: Yes    Types: Marijuana    Comment: Regular use last 8/08 16     Allergies   Patient has no known allergies.   Review of Systems Review of Systems  All other systems reviewed and are negative.    Physical Exam Updated Vital Signs BP 124/85 (BP Location: Right Arm)   Pulse 66   Temp 98.4 F (36.9 C) (Oral)   Resp 18   SpO2 100%   Physical Exam  Constitutional: He appears well-developed and well-nourished.  HENT:    Head: Normocephalic and atraumatic.  Right Ear: Tympanic membrane and external ear normal.  Left Ear: Tympanic membrane and external ear normal.  Nose: Nose normal. Right sinus exhibits no maxillary sinus tenderness and no frontal sinus tenderness. Left sinus exhibits no maxillary sinus tenderness and no frontal sinus tenderness.  Mouth/Throat:    The patient has normal phonation and is in control of secretions. No stridor.  Midline uvula without edema. Soft palate rises symmetrically.  No tonsillar erythema or exudates. No PTA. Tongue protrusion is normal. No trismus. No creptius on neck palpation. Patient is noted to have cracked teeth of numbers 17 and 31. No gingival erythema or fluctuance noted. No floor edema. Mucus membranes moist.   Eyes: Conjunctivae are normal. Right eye exhibits no discharge. Left eye exhibits no discharge. No scleral icterus.  Neck:  No nuchal rigidity or meningismus  Pulmonary/Chest: Effort normal. No respiratory distress.  Neurological: He is alert.  Skin: No pallor.  Psychiatric: He has a normal mood and affect.  Nursing note and vitals reviewed.    ED Treatments / Results  Labs (all labs ordered are listed, but only abnormal results are displayed) Labs Reviewed - No data to display  EKG  EKG Interpretation None       Radiology No results found.  Procedures Procedures (including critical care time)  Medications Ordered in ED Medications - No data to display   Initial Impression / Assessment and Plan / ED Course  I have reviewed the triage vital signs and the nursing notes.  Pertinent labs & imaging results that were available during my care of the patient were reviewed by me and considered in my medical decision making (see chart for details).     Patient with toothache.  No gross abscess.  Exam unconcerning for Ludwig's angina or spread of infection.  Will treat with penicillin,viscous lidocaine and patient can continue  anti-inflammatories medicine.  Patient is requesting dental resources.  Will provide.  Urged to follow-up with dentist.  Return precautions discussed.  Patient appears safe for discharge.    Final Clinical Impressions(s) / ED Diagnoses   Final diagnoses:  Pain, dental    ED Discharge Orders        Ordered    naproxen (NAPROSYN) 500 MG tablet  2 times daily with meals     11/02/17 0932    lidocaine (XYLOCAINE) 2 % solution  As needed     11/02/17 0932    penicillin v potassium (VEETID) 500 MG tablet  4 times daily     11/02/17 0932       Princella PellegriniMaczis, Kamaria Lucia M, PA-C 11/02/17 [id number]    Margarita Grizzleay, Danielle, MD 11/03/17 [phone]

## 2017-11-02 NOTE — ED Triage Notes (Signed)
Pt reports dealing with this dental issue for a while and reports teeth need to be pulled on lower right and left back molars

## 2018-10-10 ENCOUNTER — Other Ambulatory Visit: Payer: Self-pay

## 2018-10-10 ENCOUNTER — Emergency Department (HOSPITAL_COMMUNITY)
Admission: EM | Admit: 2018-10-10 | Discharge: 2018-10-10 | Disposition: A | Discharge status: Home / Self Care | Payer: Self-pay | Attending: Emergency Medicine | Admitting: Emergency Medicine

## 2018-10-10 ENCOUNTER — Encounter (HOSPITAL_COMMUNITY): Payer: Self-pay | Admitting: Emergency Medicine

## 2018-10-10 DIAGNOSIS — K0889 Other specified disorders of teeth and supporting structures: Secondary | ICD-10-CM | POA: Insufficient documentation

## 2018-10-10 DIAGNOSIS — Z79899 Other long term (current) drug therapy: Secondary | ICD-10-CM | POA: Insufficient documentation

## 2018-10-10 DIAGNOSIS — F17210 Nicotine dependence, cigarettes, uncomplicated: Secondary | ICD-10-CM | POA: Insufficient documentation

## 2018-10-10 MED ORDER — DICLOFENAC SODIUM 75 MG PO TBEC: 75.0000 mg | DELAYED_RELEASE_TABLET | Freq: Two times a day (BID) | ORAL | 0 refills | Status: DC

## 2018-10-10 MED ORDER — CLINDAMYCIN HCL 300 MG PO CAPS: 300.0000 mg | ORAL_CAPSULE | Freq: Three times a day (TID) | ORAL | 0 refills | Status: DC

## 2018-10-10 MED ORDER — HYDROCODONE-ACETAMINOPHEN 5-325 MG PO TABS
2.0000 | ORAL_TABLET | Freq: Once | ORAL | Status: AC
  Administered 2018-10-10: 2 via ORAL
  Filled 2018-10-10: qty 2

## 2018-10-10 NOTE — Discharge Instructions (Signed)
Return if any problems.

## 2018-10-10 NOTE — ED Provider Notes (Signed)
[facility] EMERGENCY DEPARTMENT Provider Note   CSN: [id number] Arrival date & time: 10/10/18  1415     History   Chief Complaint Chief Complaint  Patient presents with  . Dental Pain    HPI [name] Hart is a 33 y.o. male.  The history is provided by the patient. No language interpreter was used.  Dental Pain  Location:  Lower Lower teeth location:  19/LL 1st molar Quality:  Aching Severity:  Moderate Timing:  Constant Progression:  Worsening Chronicity:  New Relieved by:  Nothing Worsened by:  Nothing Ineffective treatments:  None tried Risk factors: lack of dental care     Past Medical History:  Diagnosis Date  . Fracture of distal end of tibia 11/22/2014  . Fracture of right proximal fibula 11/23/2014  . History of gonorrhea    treated august 2015  . Polysubstance abuse [facility])     Patient Active Problem List   Diagnosis Date Noted  . Acute pericoronitis 12/26/2016  . Fracture of right proximal fibula 11/23/2014  . Polysubstance abuse (HCC)   . Fracture of distal end of tibia 11/22/2014    Past Surgical History:  Procedure Laterality Date  . CRPP left wrist     As a kid  . HARDWARE REMOVAL Right 04/28/2015   Procedure: HARDWARE REMOVAL RIGHT ANKLE, five screws 1 plate, and 1 washer removed;  Surgeon: Myrene Galas, MD;  Location: [facility] OR;  Service: Orthopedics;  Laterality: Right;  . ORIF ANKLE FRACTURE Right 12/04/2014   Procedure: RIGHT OPEN REDUCTION INTERNAL FIXATION (ORIF) PILON FRACTURE;  Surgeon: Myrene Galas, MD;  Location: [facility] OR;  Service: Orthopedics;  Laterality: Right;  ANESTHESIA: GENERAL, REGIONAL BLOCK        Home Medications    Prior to Admission medications   Medication Sig Start Date End Date Taking? Authorizing Provider  acetaminophen (TYLENOL) 500 MG tablet Take 500-1,000 mg by mouth every 6 (six) hours as needed for mild pain or headache.    [provider]  amoxicillin (AMOXIL) 500 MG capsule Take 1  capsule (500 mg total) by mouth 3 (three) times daily. 10/28/16   Janne Napoleon, NP  clindamycin (CLEOCIN) 300 MG capsule Take 1 capsule (300 mg total) by mouth 3 (three) times daily. 10/10/18   Elson Areas, PA-C  diclofenac (VOLTAREN) 75 MG EC tablet Take 1 tablet (75 mg total) by mouth 2 (two) times daily. 10/10/18   Elson Areas, PA-C  lidocaine (XYLOCAINE) 2 % solution Use as directed 15 mLs in the mouth or throat as needed for mouth pain. 11/02/17   Maczis, Elmer Sow, PA-C  mupirocin ointment (BACTROBAN) 2 % Place into the nose 2 (two) times daily. 04/28/15   Montez Morita, PA-C  naproxen (NAPROSYN) 500 MG tablet Take 1 tablet (500 mg total) by mouth 2 (two) times daily with a meal. 11/02/17   Maczis, Elmer Sow, PA-C  promethazine (PHENERGAN) 12.5 MG tablet Take 1-2 tablets (12.5-25 mg total) by mouth every 6 (six) hours as needed for nausea or vomiting. 04/28/15   Montez Morita, PA-C  traMADol (ULTRAM) 50 MG tablet Take 1 tablet (50 mg total) by mouth every 8 (eight) hours as needed. 12/26/16   Loletta Specter, PA-C    Family History Family History  Problem Relation Age of Onset  . Hypertension Mother   . Diabetes type II Mother   . Hypertension Father     Social History Social History   Tobacco Use  . Smoking status:  Current Every Day Smoker    Packs/day: 0.25    Years: 15.00    Pack years: 3.75    Types: Cigarettes  . Smokeless tobacco: Never Used  Substance Use Topics  . Alcohol use: Yes    Alcohol/week: 0.0 standard drinks    Comment: occasional  . Drug use: Yes    Types: Marijuana    Comment: Regular use last 8/08 16     Allergies   Patient has no known allergies.   Review of Systems Review of Systems  All other systems reviewed and are negative.    Physical Exam Updated Vital Signs BP (!) 141/91   Pulse 60   Temp 98.4 F (36.9 C) (Oral)   Resp 17   SpO2 100%   Physical Exam Vitals signs and nursing note reviewed.  Constitutional:      Appearance:  He is well-developed.  HENT:     Head: Normocephalic.     Nose: Nose normal.     Mouth/Throat:     Mouth: Mucous membranes are moist.     Comments: Dental decay, swelling left lower gumline, no obvious abscess Eyes:     Pupils: Pupils are equal, round, and reactive to light.  Neck:     Musculoskeletal: Normal range of motion.  Cardiovascular:     Rate and Rhythm: Normal rate.  Pulmonary:     Effort: Pulmonary effort is normal.  Abdominal:     General: There is no distension.  Musculoskeletal: Normal range of motion.  Skin:    General: Skin is warm.  Neurological:     Mental Status: He is alert and oriented to person, place, and time.  Psychiatric:        Mood and Affect: Mood normal.      ED Treatments / Results  Labs (all labs ordered are listed, but only abnormal results are displayed) Labs Reviewed - No data to display  EKG None  Radiology No results found.  Procedures Procedures (including critical care time)  Medications Ordered in ED Medications - No data to display   Initial Impression / Assessment and Plan / ED Course  I have reviewed the triage vital signs and the nursing notes.  Pertinent labs & imaging results that were available during my care of the patient were reviewed by me and considered in my medical decision making (see chart for details).       Final Clinical Impressions(s) / ED Diagnoses   Final diagnoses:  Toothache    ED Discharge Orders         Ordered    clindamycin (CLEOCIN) 300 MG capsule  3 times daily     10/10/18 1433    diclofenac (VOLTAREN) 75 MG EC tablet  2 times daily     10/10/18 1433        An After Visit Summary was printed and given to the patient.    Osie Cheeks 10/10/18 1436    Tilden Fossa, MD 10/10/18 Nicholos Johns

## 2018-11-07 ENCOUNTER — Ambulatory Visit (INDEPENDENT_AMBULATORY_CARE_PROVIDER_SITE_OTHER): Payer: Self-pay | Admitting: Primary Care

## 2019-02-13 ENCOUNTER — Other Ambulatory Visit: Payer: Self-pay

## 2019-02-13 ENCOUNTER — Emergency Department (HOSPITAL_COMMUNITY)
Admission: EM | Admit: 2019-02-13 | Discharge: 2019-02-13 | Disposition: A | Discharge status: Home / Self Care | Payer: Self-pay | Attending: Emergency Medicine | Admitting: Emergency Medicine

## 2019-02-13 ENCOUNTER — Encounter (HOSPITAL_COMMUNITY): Payer: Self-pay

## 2019-02-13 DIAGNOSIS — K0889 Other specified disorders of teeth and supporting structures: Secondary | ICD-10-CM | POA: Insufficient documentation

## 2019-02-13 MED ORDER — NAPROXEN 375 MG PO TABS: 375.0000 mg | ORAL_TABLET | Freq: Two times a day (BID) | ORAL | 0 refills | Status: DC

## 2019-02-13 MED ORDER — OXYCODONE-ACETAMINOPHEN 5-325 MG PO TABS
1.0000 | ORAL_TABLET | Freq: Once | ORAL | Status: AC
  Administered 2019-02-13: 1 via ORAL
  Filled 2019-02-13: qty 1

## 2019-02-13 MED ORDER — AMOXICILLIN 500 MG PO CAPS: 500.0000 mg | ORAL_CAPSULE | Freq: Three times a day (TID) | ORAL | 0 refills | Status: DC

## 2019-02-13 NOTE — ED Provider Notes (Signed)
[facility] EMERGENCY DEPARTMENT Provider Note   CSN: [id number] Arrival date & time: 02/13/19  1720    History   Chief Complaint Chief Complaint  Patient presents with  . Dental Pain    HPI Aldyn Swenor is a 33 y.o. male.     Patient reports left lower dental pain for 2 days. He has a broken tooth in the area of the pain. He has been trying tylenol at home with minimal relief.  The history is provided by the patient.  Dental Pain  Location:  Lower Lower teeth location:  18/LL 2nd molar Quality:  Aching and throbbing Severity:  Severe Onset quality:  Gradual Duration:  2 days Timing:  Constant Progression:  Worsening Chronicity:  New Associated symptoms: facial swelling   Associated symptoms: no difficulty swallowing, no fever and no trismus     Past Medical History:  Diagnosis Date  . Fracture of distal end of tibia 11/22/2014  . Fracture of right proximal fibula 11/23/2014  . History of gonorrhea    treated august 2015  . Polysubstance abuse [facility])     Patient Active Problem List   Diagnosis Date Noted  . Acute pericoronitis 12/26/2016  . Fracture of right proximal fibula 11/23/2014  . Polysubstance abuse (HCC)   . Fracture of distal end of tibia 11/22/2014    Past Surgical History:  Procedure Laterality Date  . CRPP left wrist     As a kid  . HARDWARE REMOVAL Right 04/28/2015   Procedure: HARDWARE REMOVAL RIGHT ANKLE, five screws 1 plate, and 1 washer removed;  Surgeon: Myrene Galas, MD;  Location: [facility] OR;  Service: Orthopedics;  Laterality: Right;  . ORIF ANKLE FRACTURE Right 12/04/2014   Procedure: RIGHT OPEN REDUCTION INTERNAL FIXATION (ORIF) PILON FRACTURE;  Surgeon: Myrene Galas, MD;  Location: [facility] OR;  Service: Orthopedics;  Laterality: Right;  ANESTHESIA: GENERAL, REGIONAL BLOCK        Home Medications    Prior to Admission medications   Medication Sig Start Date End Date Taking? Authorizing Provider  acetaminophen  (TYLENOL) 500 MG tablet Take 500-1,000 mg by mouth every 6 (six) hours as needed for mild pain or headache.    [provider]  amoxicillin (AMOXIL) 500 MG capsule Take 1 capsule (500 mg total) by mouth 3 (three) times daily. 10/28/16   Janne Napoleon, NP  clindamycin (CLEOCIN) 300 MG capsule Take 1 capsule (300 mg total) by mouth 3 (three) times daily. 10/10/18   Elson Areas, PA-C  diclofenac (VOLTAREN) 75 MG EC tablet Take 1 tablet (75 mg total) by mouth 2 (two) times daily. 10/10/18   Elson Areas, PA-C  lidocaine (XYLOCAINE) 2 % solution Use as directed 15 mLs in the mouth or throat as needed for mouth pain. 11/02/17   Maczis, Elmer Sow, PA-C  mupirocin ointment (BACTROBAN) 2 % Place into the nose 2 (two) times daily. 04/28/15   Montez Morita, PA-C  naproxen (NAPROSYN) 500 MG tablet Take 1 tablet (500 mg total) by mouth 2 (two) times daily with a meal. 11/02/17   Maczis, Elmer Sow, PA-C  promethazine (PHENERGAN) 12.5 MG tablet Take 1-2 tablets (12.5-25 mg total) by mouth every 6 (six) hours as needed for nausea or vomiting. 04/28/15   Montez Morita, PA-C  traMADol (ULTRAM) 50 MG tablet Take 1 tablet (50 mg total) by mouth every 8 (eight) hours as needed. 12/26/16   Loletta Specter, PA-C    Family History Family History  Problem Relation  Age of Onset  . Hypertension Mother   . Diabetes type II Mother   . Hypertension Father     Social History Social History   Tobacco Use  . Smoking status: Current Every Day Smoker    Packs/day: 0.25    Years: 15.00    Pack years: 3.75    Types: Cigarettes  . Smokeless tobacco: Never Used  Substance Use Topics  . Alcohol use: Yes    Alcohol/week: 0.0 standard drinks    Comment: occasional  . Drug use: Yes    Types: Marijuana    Comment: Regular use last 8/08 16     Allergies   Patient has no known allergies.   Review of Systems Review of Systems  Constitutional: Negative for fever.  HENT: Positive for facial swelling.   All  other systems reviewed and are negative.    Physical Exam Updated Vital Signs BP (!) 137/101 (BP Location: Right Arm)   Pulse 73   Temp 98.2 F (36.8 C) (Oral)   Resp 16   SpO2 98%   Physical Exam Vitals signs and nursing note reviewed.  Constitutional:      Appearance: He is not ill-appearing.  HENT:     Mouth/Throat:     Mouth: Mucous membranes are moist.  Eyes:     Conjunctiva/sclera: Conjunctivae normal.  Neck:     Musculoskeletal: Neck supple.  Cardiovascular:     Rate and Rhythm: Normal rate and regular rhythm.  Pulmonary:     Effort: Pulmonary effort is normal.     Breath sounds: Normal breath sounds.  Abdominal:     Palpations: Abdomen is soft.  Musculoskeletal: Normal range of motion.  Neurological:     Mental Status: He is alert and oriented to person, place, and time.  Psychiatric:        Mood and Affect: Mood normal.      ED Treatments / Results  Labs (all labs ordered are listed, but only abnormal results are displayed) Labs Reviewed - No data to display  EKG None  Radiology No results found.  Procedures Procedures (including critical care time)  Medications Ordered in ED Medications  oxyCODONE-acetaminophen (PERCOCET/ROXICET) 5-325 MG per tablet 1 tablet (1 tablet Oral Given 02/13/19 1924)     Initial Impression / Assessment and Plan / ED Course  I have reviewed the triage vital signs and the nursing notes.  Pertinent labs & imaging results that were available during my care of the patient were reviewed by me and considered in my medical decision making (see chart for details).        Patient with dentalgia.  No abscess requiring immediate incision and drainage.  Exam not concerning for Ludwig's angina or pharyngeal abscess.  Will treat with amoxicillin and NSAID. Pt instructed to follow-up with dentist. Dental resource guide provided. Discussed return precautions. Pt safe for discharge.  Final Clinical Impressions(s) / ED Diagnoses    Final diagnoses:  Pain, dental    ED Discharge Orders         Ordered    amoxicillin (AMOXIL) 500 MG capsule  3 times daily     02/13/19 1925    naproxen (NAPROSYN) 375 MG tablet  2 times daily     02/13/19 1925           Felicie MornSmith, Lagina Reader, NP 02/13/19 1945    Pricilla LovelessGoldston, Scott, MD 02/19/19 [phone]

## 2019-06-04 ENCOUNTER — Encounter (HOSPITAL_COMMUNITY): Payer: Self-pay | Admitting: Emergency Medicine

## 2019-06-04 ENCOUNTER — Emergency Department (HOSPITAL_COMMUNITY)
Admission: EM | Admit: 2019-06-04 | Discharge: 2019-06-04 | Disposition: A | Discharge status: Home / Self Care | Payer: Self-pay | Attending: Emergency Medicine | Admitting: Emergency Medicine

## 2019-06-04 ENCOUNTER — Other Ambulatory Visit: Payer: Self-pay

## 2019-06-04 DIAGNOSIS — K0889 Other specified disorders of teeth and supporting structures: Secondary | ICD-10-CM | POA: Diagnosis not present

## 2019-06-04 DIAGNOSIS — L02415 Cutaneous abscess of right lower limb: Secondary | ICD-10-CM | POA: Diagnosis not present

## 2019-06-04 DIAGNOSIS — Z79899 Other long term (current) drug therapy: Secondary | ICD-10-CM | POA: Insufficient documentation

## 2019-06-04 DIAGNOSIS — F17210 Nicotine dependence, cigarettes, uncomplicated: Secondary | ICD-10-CM | POA: Insufficient documentation

## 2019-06-04 DIAGNOSIS — M79661 Pain in right lower leg: Secondary | ICD-10-CM | POA: Diagnosis present

## 2019-06-04 MED ORDER — LIDOCAINE-EPINEPHRINE (PF) 2 %-1:200000 IJ SOLN
10.0000 mL | Freq: Once | INTRAMUSCULAR | Status: AC
  Administered 2019-06-04: 10 mL
  Filled 2019-06-04: qty 20

## 2019-06-04 MED ORDER — CLINDAMYCIN HCL 300 MG PO CAPS: 300.0000 mg | ORAL_CAPSULE | Freq: Four times a day (QID) | ORAL | 0 refills | Status: AC

## 2019-06-04 MED FILL — CLINDAMYCIN HCL 300 MG CAPS: 300 | 10 days supply | Qty: 40 | Fill #0

## 2019-06-04 NOTE — ED Notes (Signed)
Signature pad in room not working.  

## 2019-06-04 NOTE — ED Notes (Signed)
Patient verbalizes understanding of discharge instructions. Opportunity for questioning and answers were provided. Armband removed by staff, pt discharged from ED.  

## 2019-06-04 NOTE — Discharge Instructions (Addendum)
Take clindamycin as prescribed.  Encourage you to do warm compresses on your right knee abscess 2-3 times per day.  Please get established with a PCP and dentist as soon as possible.  I have provided resources and contact information which is listed below.  Return to the ER if you develop a fever, chills, diminished range of motion or sensation, worsening swelling or significant redness, difficulty breathing, or you can no longer open your mouth or eat/drink.   Please follow-up with one of the dental clinics provided to you below or in your paperwork. Call and tell them you were seen in the Emergency Dept and arrange for an appointment. You may have to call multiple places in order to find a place to be seen.  Dental Assistance If the dentist on-call cannot see you, please use the resources below:   Patients with Medicaid: [place], [place] [address] [phone]  If unable to pay, or uninsured, contact [facility] [phone]) or [facility] [phone] in [place], [place] in [facility]) to become qualified for the adult dental clinic  Other [place] [place], [place], [place], [address]    [phone], Ext. [phone]    2nd and 4th Thursday of the month at 6:30am    10 clients each day by appointment, can sometimes see walk-in     patients if someone does not show for an appointment [place], [place], [place], [address]    [phone] [facility]- [address], [place], [place], [address]    [phone]  [place] Department- [phone] [place] [facility] Department- [phone]
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
## 2019-08-23 ENCOUNTER — Other Ambulatory Visit: Payer: Self-pay

## 2019-08-23 ENCOUNTER — Encounter (HOSPITAL_COMMUNITY): Payer: Self-pay | Admitting: Emergency Medicine

## 2019-08-23 ENCOUNTER — Emergency Department (HOSPITAL_COMMUNITY)
Admission: EM | Admit: 2019-08-23 | Discharge: 2019-08-23 | Disposition: A | Discharge status: Home / Self Care | Payer: Self-pay | Attending: Emergency Medicine | Admitting: Emergency Medicine

## 2019-08-23 DIAGNOSIS — Z791 Long term (current) use of non-steroidal anti-inflammatories (NSAID): Secondary | ICD-10-CM | POA: Insufficient documentation

## 2019-08-23 DIAGNOSIS — F17210 Nicotine dependence, cigarettes, uncomplicated: Secondary | ICD-10-CM | POA: Insufficient documentation

## 2019-08-23 DIAGNOSIS — K0889 Other specified disorders of teeth and supporting structures: Secondary | ICD-10-CM | POA: Insufficient documentation

## 2019-08-23 MED ORDER — LIDOCAINE VISCOUS HCL 2 % MT SOLN: 15.0000 mL | OROMUCOSAL | 0 refills | Status: DC | PRN

## 2019-08-23 MED ORDER — AMOXICILLIN 500 MG PO CAPS: 500.0000 mg | ORAL_CAPSULE | Freq: Three times a day (TID) | ORAL | 0 refills | Status: AC

## 2019-08-23 MED ORDER — IBUPROFEN 800 MG PO TABS
800.0000 mg | ORAL_TABLET | Freq: Once | ORAL | Status: AC
  Administered 2019-08-23: 800 mg via ORAL
  Filled 2019-08-23: qty 1

## 2019-08-23 MED ORDER — IBUPROFEN 600 MG PO TABS: 600.0000 mg | ORAL_TABLET | Freq: Three times a day (TID) | ORAL | 0 refills | Status: DC | PRN

## 2019-08-23 NOTE — ED Provider Notes (Signed)
[facility] Provider Note   CSN: [id number] Arrival date & time: 08/23/19  1553     History Chief Complaint  Patient presents with  . Dental Pain    [name] Hart is a 33 y.o. male presenting for evaluation of tooth pain.  Patient states 2 days ago he started develop tooth pain on the right side.  He had associated right cheek swelling.  He found some leftover antibiotics that he thinks was clindamycin from a previous tooth infection.  He took them, which improved the swelling on his right side.  However, he also had pain on his left lower tooth.  This is persistent despite antibiotics.  He has no antibiotics left.  He denies fevers, chills, cough, nausea, vomiting.  He has a history of problems with this tooth, but has not yet followed up with a dentist due to cost constraints and Covid.  He has no other medical problems, takes no medications daily.  HPI     Past Medical History:  Diagnosis Date  . Fracture of distal end of tibia 11/22/2014  . Fracture of right proximal fibula 11/23/2014  . History of gonorrhea    treated august 2015  . Polysubstance abuse [facility](HCC)     Patient Active Problem List   Diagnosis Date Noted  . Acute pericoronitis 12/26/2016  . Fracture of right proximal fibula 11/23/2014  . Polysubstance abuse (HCC)   . Fracture of distal end of tibia 11/22/2014    Past Surgical History:  Procedure Laterality Date  . CRPP left wrist     As a kid  . HARDWARE REMOVAL Right 04/28/2015   Procedure: HARDWARE REMOVAL RIGHT ANKLE, five screws 1 plate, and 1 washer removed;  Surgeon: Myrene GalasMichael Handy, MD;  [facility];  Service: Orthopedics;  Laterality: Right;  . ORIF ANKLE FRACTURE Right 12/04/2014   Procedure: RIGHT OPEN REDUCTION INTERNAL FIXATION (ORIF) PILON FRACTURE;  Surgeon: Myrene GalasMichael Handy, MD;  [facility];  Service: Orthopedics;  Laterality: Right;  ANESTHESIA: GENERAL, REGIONAL BLOCK       Family History    Problem Relation Age of Onset  . Hypertension Mother   . Diabetes type II Mother   . Hypertension Father     Social History   Tobacco Use  . Smoking status: Current Every Day Smoker    Packs/day: 0.25    Years: 15.00    Pack years: 3.75    Types: Cigarettes  . Smokeless tobacco: Never Used  Substance Use Topics  . Alcohol use: Yes    Alcohol/week: 0.0 standard drinks    Comment: occasional  . Drug use: Yes    Types: Marijuana    Comment: Regular use last 8/08 16    Home Medications Prior to Admission medications   Medication Sig Start Date End Date Taking? Authorizing Provider  acetaminophen (TYLENOL) 500 MG tablet Take 500-1,000 mg by mouth every 6 (six) hours as needed for mild pain or headache.    [provider]  amoxicillin (AMOXIL) 500 MG capsule Take 1 capsule (500 mg total) by mouth 3 (three) times daily for 10 days. 08/23/19 09/02/19  Kylie Gros, PA-C  diclofenac (VOLTAREN) 75 MG EC tablet Take 1 tablet (75 mg total) by mouth 2 (two) times daily. 10/10/18   Elson AreasSofia, Leslie K, PA-C  ibuprofen (ADVIL) 600 MG tablet Take 1 tablet (600 mg total) by mouth 3 (three) times daily as needed. 08/23/19   Ila Landowski, PA-C  lidocaine (XYLOCAINE) 2 % solution Use  as directed 15 mLs in the mouth or throat as needed for mouth pain. 08/23/19   Shaela Boer, PA-C  mupirocin ointment (BACTROBAN) 2 % Place into the nose 2 (two) times daily. 04/28/15   Montez Morita, PA-C  naproxen (NAPROSYN) 375 MG tablet Take 1 tablet (375 mg total) by mouth 2 (two) times daily. 02/13/19   Felicie Morn, NP  promethazine (PHENERGAN) 12.5 MG tablet Take 1-2 tablets (12.5-25 mg total) by mouth every 6 (six) hours as needed for nausea or vomiting. 04/28/15   Montez Morita, PA-C  traMADol (ULTRAM) 50 MG tablet Take 1 tablet (50 mg total) by mouth every 8 (eight) hours as needed. 12/26/16   Loletta Specter, PA-C    Allergies    Patient has no known allergies.  Review of Systems   Review  of Systems  Constitutional: Negative for fever.  HENT: Positive for dental problem.     Physical Exam Updated Vital Signs BP (!) 132/99 (BP Location: Right Arm)   Pulse (!) 55   Temp 98.5 F (36.9 C) (Oral)   Resp 20   SpO2 100%   Physical Exam Vitals and nursing note reviewed.  Constitutional:      General: He is not in acute distress.    Appearance: He is well-developed.     Comments: Sitting comfortably in the bed in no acute distress  HENT:     Head: Normocephalic and atraumatic.     Mouth/Throat:      Comments: Left lower molar almost completely eroded.  Tender to palpation of the cleft of the tooth and surrounding gum.  No obvious abscess. Overall poor dentition.  No tenderness palpation of any teeth on the right side.  No obvious facial swelling at this time.  No pain under the tongue.  No swelling extending into the neck. No trismus Cardiovascular:     Rate and Rhythm: Normal rate and regular rhythm.     Pulses: Normal pulses.  Pulmonary:     Effort: Pulmonary effort is normal.     Breath sounds: Normal breath sounds.  Abdominal:     General: There is no distension.  Musculoskeletal:        General: Normal range of motion.     Cervical back: Normal range of motion.  Skin:    General: Skin is warm.     Findings: No rash.  Neurological:     Mental Status: He is alert and oriented to person, place, and time.     ED Results / Procedures / Treatments   Labs (all labs ordered are listed, but only abnormal results are displayed) Labs Reviewed - No data to display  EKG None  Radiology No results found.  Procedures Procedures (including critical care time)  Medications Ordered in ED Medications  ibuprofen (ADVIL) tablet 800 mg (800 mg Oral Given 08/23/19 1807)    ED Course  I have reviewed the triage vital signs and the nursing notes.  Pertinent labs & imaging results that were available during my care of the patient were reviewed by me and considered  in my medical decision making (see chart for details).    MDM Rules/Calculators/A&P     CHA2DS2/VAS Stroke Risk Points      N/A >= 2 Points: High Risk  1 - 1.99 Points: Medium Risk  0 Points: Low Risk    A final score could not be computed because of missing components.: Last  Change: N/A     This score determines the patient's  risk of having a stroke if the  patient has atrial fibrillation.      This score is not applicable to this patient. Components are not  calculated.                   Patient presented for evaluation of dental pain.  Physical exam reassuring, he appears nontoxic.  No sign of Ludwig's on exam.  Overall poor dentition.  Left lower molar almost completely eroded and tender.  As patient has worsening pain, likely becoming infected.  As this was not improving with the clindamycin, will treat with amoxicillin, as patient has had good results with this in the past.  Discussed importance of following up with dentistry.  At this time, patient appears safe for discharge.  Return precautions given.  Patient states he understands and agrees to plan.  Final Clinical Impression(s) / ED Diagnoses Final diagnoses:  Pain, dental    Rx / DC Orders ED Discharge Orders         Ordered    amoxicillin (AMOXIL) 500 MG capsule  3 times daily     08/23/19 1715    ibuprofen (ADVIL) 600 MG tablet  3 times daily PRN     08/23/19 1715    lidocaine (XYLOCAINE) 2 % solution  As needed     08/23/19 1715           Franchot Heidelberg, PA-C 08/23/19 1912    Wyvonnia Dusky, MD 08/23/19 2153

## 2019-08-23 NOTE — ED Triage Notes (Signed)
Patient c/o left lower dental pain for "a while." denies any fevers or swelling.

## 2019-08-23 NOTE — Discharge Instructions (Signed)
Take antibiotics as prescribed.  Take the entire course, even if your symptoms improve. Take ibuprofen 3 times a day with meals.  Do not take other anti-inflammatories at the same time open (Advil, Motrin, naproxen, Aleve). You may supplement with Tylenol if you need further pain control. Use viscous lidocaine to help with pain and swelling. It is very important that you follow-up with a dentist.  There is information about dentists in the area and across in the paperwork. Return to the emergency room if you develop high fevers, severe worsening pain, inability to open your mouth, or any new, worsening, or concerning symptoms.

## 2019-10-31 ENCOUNTER — Other Ambulatory Visit: Payer: Self-pay

## 2019-10-31 ENCOUNTER — Encounter (HOSPITAL_COMMUNITY): Payer: Self-pay

## 2019-10-31 ENCOUNTER — Emergency Department (HOSPITAL_COMMUNITY)
Admission: EM | Admit: 2019-10-31 | Discharge: 2019-10-31 | Disposition: A | Discharge status: Home / Self Care | Payer: Self-pay | Attending: Emergency Medicine | Admitting: Emergency Medicine

## 2019-10-31 DIAGNOSIS — F17210 Nicotine dependence, cigarettes, uncomplicated: Secondary | ICD-10-CM | POA: Insufficient documentation

## 2019-10-31 DIAGNOSIS — K0889 Other specified disorders of teeth and supporting structures: Secondary | ICD-10-CM | POA: Insufficient documentation

## 2019-10-31 DIAGNOSIS — K029 Dental caries, unspecified: Secondary | ICD-10-CM | POA: Insufficient documentation

## 2019-10-31 MED ORDER — DICLOFENAC SODIUM 75 MG PO TBEC: 75.0000 mg | DELAYED_RELEASE_TABLET | Freq: Two times a day (BID) | ORAL | 0 refills | Status: DC

## 2019-10-31 NOTE — ED Triage Notes (Addendum)
Pt arrived POV for c/o left sided dental pain. Pt states it is painful to chew on that sidex1 wk. Pt states it's getting worse. Pt has 1+ edema of right side of face.

## 2019-10-31 NOTE — Discharge Instructions (Signed)
Recommend Advil liquid gels 800 mg and 650 mg of Tylenol every 8 hours. If pain is not improving with above recommendations, continue with Tylenol and take diclofenac, stop the Advil. Recommend rinse with Listerine after every meal.  You do not have an infection today.  The pain you are experiencing is because the nerve in your tooth is exposed.  You need to follow-up with a dentist to have this tooth removed.  You were given a referral today to see a local dentist as well as a Pharmacist, [facility] in the area.  Also discussed following up with [facility], you can make an appointment on your phone.

## 2019-10-31 NOTE — ED Provider Notes (Signed)
[facility] EMERGENCY DEPARTMENT Provider Note   CSN: [id number] Arrival date & time: 10/31/19  1646     History Chief Complaint  Patient presents with  . Dental Pain    [name] Hart is a 34 y.o. male.  34 year old male presents with recurrent left lower dental pain for several years, states that he feels like his cheek is swollen and thinks he needs an antibiotic at this point.  Patient denies fevers, trauma to the area, drainage from the area.  No trismus or difficulty swallowing.  Patient has not followed up with a dentist due to financial reasons.  No other complaints or concerns today.        Past Medical History:  Diagnosis Date  . Fracture of distal end of tibia 11/22/2014  . Fracture of right proximal fibula 11/23/2014  . History of gonorrhea    treated august 2015  . Polysubstance abuse [facility])     Patient Active Problem List   Diagnosis Date Noted  . Acute pericoronitis 12/26/2016  . Fracture of right proximal fibula 11/23/2014  . Polysubstance abuse (HCC)   . Fracture of distal end of tibia 11/22/2014    Past Surgical History:  Procedure Laterality Date  . CRPP left wrist     As a kid  . HARDWARE REMOVAL Right 04/28/2015   Procedure: HARDWARE REMOVAL RIGHT ANKLE, five screws 1 plate, and 1 washer removed;  Surgeon: Myrene Galas, MD;  Location: [facility] OR;  Service: Orthopedics;  Laterality: Right;  . ORIF ANKLE FRACTURE Right 12/04/2014   Procedure: RIGHT OPEN REDUCTION INTERNAL FIXATION (ORIF) PILON FRACTURE;  Surgeon: Myrene Galas, MD;  Location: [facility] OR;  Service: Orthopedics;  Laterality: Right;  ANESTHESIA: GENERAL, REGIONAL BLOCK       Family History  Problem Relation Age of Onset  . Hypertension Mother   . Diabetes type II Mother   . Hypertension Father     Social History   Tobacco Use  . Smoking status: Current Every Day Smoker    Packs/day: 0.25    Years: 15.00    Pack years: 3.75    Types: Cigarettes  . Smokeless  tobacco: Never Used  Substance Use Topics  . Alcohol use: Yes    Alcohol/week: 0.0 standard drinks    Comment: occasional  . Drug use: Yes    Types: Marijuana    Comment: Regular use last 8/08 16    Home Medications Prior to Admission medications   Medication Sig Start Date End Date Taking? Authorizing Provider  acetaminophen (TYLENOL) 500 MG tablet Take 500-1,000 mg by mouth every 6 (six) hours as needed for mild pain or headache.    [provider]  diclofenac (VOLTAREN) 75 MG EC tablet Take 1 tablet (75 mg total) by mouth 2 (two) times daily. 10/31/19   Jeannie Fend, PA-C  lidocaine (XYLOCAINE) 2 % solution Use as directed 15 mLs in the mouth or throat as needed for mouth pain. 08/23/19   Caccavale, Sophia, PA-C  mupirocin ointment (BACTROBAN) 2 % Place into the nose 2 (two) times daily. 04/28/15   Montez Morita, PA-C  promethazine (PHENERGAN) 12.5 MG tablet Take 1-2 tablets (12.5-25 mg total) by mouth every 6 (six) hours as needed for nausea or vomiting. 04/28/15   Montez Morita, PA-C    Allergies    Patient has no known allergies.  Review of Systems   Review of Systems  Constitutional: Negative for fever.  HENT: Positive for dental problem and facial swelling. Negative for  ear pain, trouble swallowing and voice change.   Gastrointestinal: Negative for nausea and vomiting.  Musculoskeletal: Negative for neck pain.  Skin: Negative for rash and wound.  Allergic/Immunologic: Negative for immunocompromised state.  Neurological: Negative for headaches.  Hematological: Negative for adenopathy.  Psychiatric/Behavioral: Negative for confusion.    Physical Exam Updated Vital Signs BP (!) 139/112   Pulse 60   Temp 98.7 F (37.1 C) (Oral)   Resp 16   Ht 5\' 6"  (1.676 m)   Wt 104.3 kg   SpO2 100%   BMI 37.12 kg/m   Physical Exam Vitals and nursing note reviewed.  Constitutional:      General: He is not in acute distress.    Appearance: He is well-developed. He is not  diaphoretic.  HENT:     Head: Normocephalic and atraumatic.     Jaw: No trismus or swelling.     Mouth/Throat:   Pulmonary:     Effort: Pulmonary effort is normal.  Neurological:     Mental Status: He is alert and oriented to person, place, and time.  Psychiatric:        Behavior: Behavior normal.     ED Results / Procedures / Treatments   Labs (all labs ordered are listed, but only abnormal results are displayed) Labs Reviewed - No data to display  EKG None  Radiology No results found.  Procedures Procedures (including critical care time)  Medications Ordered in ED Medications - No data to display  ED Course  I have reviewed the triage vital signs and the nursing notes.  Pertinent labs & imaging results that were available during my care of the patient were reviewed by me and considered in my medical decision making (see chart for details).  Clinical Course as of Oct 31 1727  Thu Oct 30, 6964  5165 34 year old male with recurrent left dental pain without trauma, fever, drainage.  On exam there is no appreciable facial swelling, no submandibular swelling, no trismus.  Left lower molar with decay to the gumline, suspect pain is coming from nerve exposure as opposed to abscess as there is no evidence of infection at this time.  Patient reports barriers to dental care being financial situation as well as "no one will answer the phone and I call."  Patient was given resource list as well as dental referral, also advised checking [email or web address] and inform patient he can make this appointment from his phone.  Patient agrees to follow-up.  Explained rinsing with Listerine after every meal, recommend Advil liquid gels with Tylenol.  If Advil liquid gel and Tylenol are not helping with his pain, he should discontinue the Advil, continue with Tylenol and may take Voltaren.   [LM]    Clinical Course User Index [LM] Roque Lias   MDM Rules/Calculators/A&P                       Final Clinical Impression(s) / ED Diagnoses Final diagnoses:  Pain, dental    Rx / DC Orders ED Discharge Orders         Ordered    diclofenac (VOLTAREN) 75 MG EC tablet  2 times daily     10/31/19 1714           Tacy Learn, PA-C 10/31/19 1729    Wyvonnia Dusky, MD 10/31/19 1958

## 2019-11-01 ENCOUNTER — Other Ambulatory Visit: Payer: Self-pay

## 2019-11-01 ENCOUNTER — Emergency Department (HOSPITAL_COMMUNITY)
Admission: EM | Admit: 2019-11-01 | Discharge: 2019-11-01 | Disposition: A | Discharge status: Home / Self Care | Payer: Self-pay | Attending: Emergency Medicine | Admitting: Emergency Medicine

## 2019-11-01 ENCOUNTER — Encounter (HOSPITAL_COMMUNITY): Payer: Self-pay

## 2019-11-01 DIAGNOSIS — F17210 Nicotine dependence, cigarettes, uncomplicated: Secondary | ICD-10-CM | POA: Insufficient documentation

## 2019-11-01 DIAGNOSIS — Z79899 Other long term (current) drug therapy: Secondary | ICD-10-CM | POA: Insufficient documentation

## 2019-11-01 DIAGNOSIS — K0889 Other specified disorders of teeth and supporting structures: Secondary | ICD-10-CM | POA: Insufficient documentation

## 2019-11-01 MED ORDER — AMOXICILLIN-POT CLAVULANATE 875-125 MG PO TABS: 1.0000 | ORAL_TABLET | Freq: Two times a day (BID) | ORAL | 0 refills | Status: AC

## 2019-11-01 MED FILL — DICLOFENAC SODIUM 75 MG TAB: 75 | 10 days supply | Qty: 20 | Fill #0

## 2019-11-01 NOTE — Discharge Instructions (Signed)
You were given a prescription for antibiotics. Please take the antibiotic prescription fully.   Please follow-up with a dentist in the next 5 to 7 days for reevaluation.  If you do not have a dentist, resources were provided for dentist in the area in your discharge summary.  Please contact one of the offices that are listed and make an appointment for follow-up.  Please return to the emergency department for any new or worsening symptoms.  

## 2019-11-01 NOTE — ED Triage Notes (Signed)
Pt returns to get rx for abx for tooth abscess. Pt having pain to upper right side, states the abscess opened up while he was on the phone with a dentist, was told to come here to get rx

## 2019-11-01 NOTE — ED Notes (Signed)
The pt reports that he has had a toothache for 2 years  He was seen here last pm  And he talked to someone on the phone earlier today and he reports also that he was told to come back and get a rx for an antibiotic

## 2019-11-01 NOTE — ED Provider Notes (Signed)
[facility] EMERGENCY DEPARTMENT Provider Note   CSN: [id number] Arrival date & time: 11/01/19  1608     History Chief Complaint  Patient presents with  . Dental Pain    [name] Hart is a 34 y.o. male.  HPI   34 year old male presenting for evaluation of dental pain.  States he was seen in the ED yesterday for evaluation and was given medications but not antibiotics.  He called the dentist today to arrange follow-up and while he was on the phone he noticed some drainage inside his mouth that was consistent with a draining abscess.  The dentist advised him to come to the ED for antibiotics.  He had some facial swelling yesterday that has since improved.  Denies any fevers or other symptoms.  Past Medical History:  Diagnosis Date  . Fracture of distal end of tibia 11/22/2014  . Fracture of right proximal fibula 11/23/2014  . History of gonorrhea    treated august 2015  . Polysubstance abuse [facility])     Patient Active Problem List   Diagnosis Date Noted  . Acute pericoronitis 12/26/2016  . Fracture of right proximal fibula 11/23/2014  . Polysubstance abuse ([place])   . Fracture of distal end of tibia 11/22/2014    Past Surgical History:  Procedure Laterality Date  . CRPP left wrist     As a kid  . HARDWARE REMOVAL Right 04/28/2015   Procedure: HARDWARE REMOVAL RIGHT ANKLE, five screws 1 plate, and 1 washer removed;  Surgeon: Altamese Kalifornsky, MD;  Location: [place];  Service: Orthopedics;  Laterality: Right;  . ORIF ANKLE FRACTURE Right 12/04/2014   Procedure: RIGHT OPEN REDUCTION INTERNAL FIXATION (ORIF) PILON FRACTURE;  Surgeon: Altamese , MD;  Location: [place];  Service: Orthopedics;  Laterality: Right;  ANESTHESIA: GENERAL, REGIONAL BLOCK       Family History  Problem Relation Age of Onset  . Hypertension Mother   . Diabetes type II Mother   . Hypertension Father     Social History   Tobacco Use  . Smoking status: Current Every Day Smoker   Packs/day: 0.25    Years: 15.00    Pack years: 3.75    Types: Cigarettes  . Smokeless tobacco: Never Used  Substance Use Topics  . Alcohol use: Yes    Alcohol/week: 0.0 standard drinks    Comment: occasional  . Drug use: Yes    Types: Marijuana    Comment: Regular use last 8/08 16    Home Medications Prior to Admission medications   Medication Sig Start Date End Date Taking? Authorizing Provider  acetaminophen (TYLENOL) 500 MG tablet Take 500-1,000 mg by mouth every 6 (six) hours as needed for mild pain or headache.    [provider]  amoxicillin-clavulanate (AUGMENTIN) 875-125 MG tablet Take 1 tablet by mouth 2 (two) times daily for 7 days. 11/01/19 11/08/19  Desten Manor S, PA-C  diclofenac (VOLTAREN) 75 MG EC tablet Take 1 tablet (75 mg total) by mouth 2 (two) times daily. 10/31/19   Tacy Learn, PA-C  lidocaine (XYLOCAINE) 2 % solution Use as directed 15 mLs in the mouth or throat as needed for mouth pain. 08/23/19   Caccavale, Sophia, PA-C  mupirocin ointment (BACTROBAN) 2 % Place into the nose 2 (two) times daily. 04/28/15   Ainsley Spinner, PA-C  promethazine (PHENERGAN) 12.5 MG tablet Take 1-2 tablets (12.5-25 mg total) by mouth every 6 (six) hours as needed for nausea or vomiting. 04/28/15   Ainsley Spinner,  PA-C    Allergies    Patient has no known allergies.  Review of Systems   Review of Systems  Constitutional: Negative for fever.  HENT: Positive for dental problem.     Physical Exam Updated Vital Signs BP (!) 128/109 (BP Location: Right Arm)   Pulse 89   Temp 98.6 F (37 C) (Oral)   Resp 17   SpO2 98%   Physical Exam Constitutional:      General: He is not in acute distress.    Appearance: He is well-developed.  HENT:     Mouth/Throat:     Comments: Multiple dental caries present.  There is some tenderness along the right upper gumline with tenderness to percussion of the third tooth.  He also has tenderness to tooth #17 with fracture present.  No  sublingual or submandibular swelling.  No trismus.  No large abscess present. Eyes:     Conjunctiva/sclera: Conjunctivae normal.  Cardiovascular:     Rate and Rhythm: Normal rate and regular rhythm.  Pulmonary:     Effort: Pulmonary effort is normal.     Breath sounds: Normal breath sounds.  Skin:    General: Skin is warm and dry.  Neurological:     Mental Status: He is alert and oriented to person, place, and time.     ED Results / Procedures / Treatments   Labs (all labs ordered are listed, but only abnormal results are displayed) Labs Reviewed - No data to display  EKG None  Radiology No results found.  Procedures Procedures (including critical care time)  Medications Ordered in ED Medications - No data to display  ED Course  I have reviewed the triage vital signs and the nursing notes.  Pertinent labs & imaging results that were available during my care of the patient were reviewed by me and considered in my medical decision making (see chart for details).    MDM Rules/Calculators/A&P                      Patient with toothache.  No gross abscess.  Exam unconcerning for Ludwig's angina or spread of infection.  Will treat with augmentin, peridex, and pain medicine.  Urged patient to follow-up with dentist.    Final Clinical Impression(s) / ED Diagnoses Final diagnoses:  Pain, dental    Rx / DC Orders ED Discharge Orders         Ordered    amoxicillin-clavulanate (AUGMENTIN) 875-125 MG tablet  2 times daily     11/01/19 1750           Abdi Husak S, PA-C 11/01/19 1750    Glynn Octave, MD 11/01/19 2259

## 2020-01-13 ENCOUNTER — Ambulatory Visit: Payer: Self-pay | Attending: Nurse Practitioner | Admitting: Nurse Practitioner

## 2020-03-04 ENCOUNTER — Other Ambulatory Visit: Payer: Self-pay

## 2020-03-04 ENCOUNTER — Encounter: Payer: Self-pay | Admitting: Nurse Practitioner

## 2020-03-04 ENCOUNTER — Ambulatory Visit: Payer: Self-pay | Attending: Nurse Practitioner | Admitting: Nurse Practitioner

## 2020-03-04 VITALS — Ht 67.0 in | Wt 230.0 lb

## 2020-03-04 DIAGNOSIS — S8991XD Unspecified injury of right lower leg, subsequent encounter: Secondary | ICD-10-CM

## 2020-03-04 DIAGNOSIS — K089 Disorder of teeth and supporting structures, unspecified: Secondary | ICD-10-CM

## 2020-03-04 DIAGNOSIS — Z7689 Persons encountering health services in other specified circumstances: Secondary | ICD-10-CM

## 2020-03-04 MED ORDER — CHLORHEXIDINE GLUCONATE 0.12 % MT SOLN: 15.0000 mL | Freq: Two times a day (BID) | OROMUCOSAL | 0 refills | Status: DC

## 2020-03-04 MED FILL — CHLORHEXIDINE 0.12% RINSE: 0.12 | 30 days supply | Qty: 946 | Fill #0

## 2020-03-04 NOTE — Progress Notes (Signed)
Virtual Visit via Telephone Note Due to national recommendations of social distancing due to COVID 19, telehealth visit is felt to be most appropriate for this patient at this time.  I discussed the limitations, risks, security and privacy concerns of performing an evaluation and management service by telephone and the availability of in person appointments. I also discussed with the patient that there may be a patient responsible charge related to this service. The patient expressed understanding and agreed to proceed.    I connected with [name] Hart on 03/04/20  at   8:50 AM EDT  EDT by telephone and verified that I am speaking with the correct person using two identifiers.   Consent I discussed the limitations, risks, security and privacy concerns of performing an evaluation and management service by telephone and the availability of in person appointments. I also discussed with the patient that there may be a patient responsible charge related to this service. The patient expressed understanding and agreed to proceed.   Location of Patient: Private  Residence    Location of Provider: [facility]    Persons participating in Telemedicine visit: [name] Denver FNP-BC [facility] CMA [name] Hart    History of Present Illness: Telemedicine visit for: Establish Care  He has very poor dentition.  Main concern today is chronic dental pain.  He has been seen numerous times in the emergency room for the same issue.  Still has not followed up with a dentist at this time due to lack of insurance.  Has repeatedly been instructed to follow-up with an oral surgeon.  I did give him a few resources of free dental clinics in the area. Patient has been advised to apply for financial assistance and schedule to see our financial counselor.   States he has been told he needs to be evaluated for hypertension.  We will have him follow-up in the office for blood pressure check.   It does appear his elevated blood pressure readings have been only noted in the emergency room when he was being evaluated for dental pain.  He has nodule beneath the right patella that has increased in size over the past 3 months. Currently the size of tennis ball. He states he fell on concrete and landed on his right knee  Several months ago. Was not evaluted for this injury by any urgent care or ED. Does not intermittent  purulent drainage from the area. He has been instructed to upload pictures of the area on mychart for review. May need to be evaluated by general surgery or ortho based on findings.    Past Medical History:  Diagnosis Date  . Fracture of distal end of tibia 11/22/2014  . Fracture of right proximal fibula 11/23/2014  . History of gonorrhea    treated august 2015  . Polysubstance abuse [facility])     Past Surgical History:  Procedure Laterality Date  . CRPP left wrist     As a kid  . HARDWARE REMOVAL Right 04/28/2015   Procedure: HARDWARE REMOVAL RIGHT ANKLE, five screws 1 plate, and 1 washer removed;  Surgeon: Myrene Galas, MD;  Location: [facility] OR;  Service: Orthopedics;  Laterality: Right;  . ORIF ANKLE FRACTURE Right 12/04/2014   Procedure: RIGHT OPEN REDUCTION INTERNAL FIXATION (ORIF) PILON FRACTURE;  Surgeon: Myrene Galas, MD;  Location: [facility] OR;  Service: Orthopedics;  Laterality: Right;  ANESTHESIA: GENERAL, REGIONAL BLOCK    Family History  Problem Relation Age of Onset  . Hypertension Mother   .  Diabetes type II Mother   . Hypertension Father     Social History   Socioeconomic History  . Marital status: Single    Spouse name: Not on file  . Number of children: Not on file  . Years of education: Not on file  . Highest education level: Not on file  Occupational History  . Not on file  Tobacco Use  . Smoking status: Current Every Day Smoker    Packs/day: 0.25    Years: 15.00    Pack years: 3.75    Types: Cigarettes  . Smokeless tobacco: Never Used  Substance  and Sexual Activity  . Alcohol use: Yes    Alcohol/week: 0.0 standard drinks    Comment: occasional  . Drug use: Yes    Types: Marijuana    Comment: Regular use last 8/08 16  . Sexual activity: Yes  Other Topics Concern  . Not on file  Social History Narrative   Employed by a temp agency   Social Determinants of Health   Financial Resource Strain:   . Difficulty of Paying Living Expenses:   Food Insecurity:   . Worried About Charity fundraiser in the Last Year:   . Arboriculturist in the Last Year:   Transportation Needs:   . Film/video editor (Medical):   Marland Kitchen Lack of Transportation (Non-Medical):   Physical Activity:   . Days of Exercise per Week:   . Minutes of Exercise per Session:   Stress:   . Feeling of Stress :   Social Connections:   . Frequency of Communication with Friends and Family:   . Frequency of Social Gatherings with Friends and Family:   . Attends Religious Services:   . Active Member of Clubs or Organizations:   . Attends Archivist Meetings:   Marland Kitchen Marital Status:      Observations/Objective: Awake, alert and oriented x 3   Review of Systems  Constitutional: Negative for fever, malaise/fatigue and weight loss.  HENT: Negative.  Negative for nosebleeds.        SEE HPI  Eyes: Negative.  Negative for blurred vision, double vision and photophobia.  Respiratory: Negative.  Negative for cough and shortness of breath.   Cardiovascular: Negative.  Negative for chest pain, palpitations and leg swelling.  Gastrointestinal: Negative.  Negative for heartburn, nausea and vomiting.  Musculoskeletal: Negative.  Negative for myalgias.       SEE HPI  Neurological: Negative.  Negative for dizziness, focal weakness, seizures and headaches.  Psychiatric/Behavioral: Negative.  Negative for suicidal ideas.    Assessment and Plan: [name] Hart was seen today for establish care.  Diagnoses and all orders for this visit:  Encounter to establish care  Poor  dentition -     chlorhexidine (PERIDEX) 0.12 % solution; Use as directed 15 mLs in the mouth or throat 2 (two) times daily.  Injury of right knee, subsequent encounter He is to upload pictures of the right knee via mychart   Follow Up Instructions Return in about 4 weeks (around 04/01/2020) for BP recheck.     I discussed the assessment and treatment plan with the patient. The patient was provided an opportunity to ask questions and all were answered. The patient agreed with the plan and demonstrated an understanding of the instructions.   The patient was advised to call back or seek an in-person evaluation if the symptoms worsen or if the condition fails to improve as anticipated.  I provided 19 minutes of  non-face-to-face time during this encounter including median intraservice time, reviewing previous notes, labs, imaging, medications and explaining diagnosis and management.  Gildardo Pounds, FNP-BC

## 2020-03-11 ENCOUNTER — Other Ambulatory Visit: Payer: Self-pay | Admitting: Nurse Practitioner

## 2020-03-11 ENCOUNTER — Ambulatory Visit: Payer: Self-pay | Attending: Nurse Practitioner

## 2020-03-11 ENCOUNTER — Other Ambulatory Visit: Payer: Self-pay

## 2020-03-11 DIAGNOSIS — Z130 Encounter for screening for diseases of the blood and blood-forming organs and certain disorders involving the immune mechanism: Secondary | ICD-10-CM

## 2020-03-11 DIAGNOSIS — Z13228 Encounter for screening for other metabolic disorders: Secondary | ICD-10-CM

## 2020-03-11 DIAGNOSIS — Z13220 Encounter for screening for lipoid disorders: Secondary | ICD-10-CM

## 2020-03-11 DIAGNOSIS — Z131 Encounter for screening for diabetes mellitus: Secondary | ICD-10-CM

## 2020-03-12 LAB — LIPID PANEL
Chol/HDL Ratio: 3.1 ratio (ref 0.0–5.0)
Cholesterol, Total: 163 mg/dL (ref 100–199)
HDL: 52 mg/dL (ref >39)
LDL Chol Calc (NIH): 93 mg/dL (ref 0–99)
Triglycerides: 101 mg/dL (ref 0–149)
VLDL Cholesterol Cal: 18 mg/dL (ref 5–40)

## 2020-03-12 LAB — CMP14+EGFR
ALT: 18 IU/L (ref 0–44)
AST: 19 IU/L (ref 0–40)
Albumin/Globulin Ratio: 1.4 (ref 1.2–2.2)
Albumin: 4.3 g/dL (ref 4.0–5.0)
Alkaline Phosphatase: 81 IU/L (ref 48–121)
BUN/Creatinine Ratio: 13 (ref 9–20)
BUN: 14 mg/dL (ref 6–20)
Bilirubin Total: 0.3 mg/dL (ref 0.0–1.2)
CO2: 17 mmol/L — ABNORMAL LOW (ref 20–29)
Calcium: 9.1 mg/dL (ref 8.7–10.2)
Chloride: 106 mmol/L (ref 96–106)
Creatinine, Ser: 1.09 mg/dL (ref 0.76–1.27)
GFR calc Af Amer: 101 mL/min/{1.73_m2} (ref >59)
GFR calc non Af Amer: 87 mL/min/{1.73_m2} (ref >59)
Globulin, Total: 3 g/dL (ref 1.5–4.5)
Glucose: 88 mg/dL (ref 65–99)
Potassium: 4.4 mmol/L (ref 3.5–5.2)
Sodium: 140 mmol/L (ref 134–144)
Total Protein: 7.3 g/dL (ref 6.0–8.5)

## 2020-03-12 LAB — CBC
Hematocrit: 44.8 % (ref 37.5–51.0)
Hemoglobin: 14.6 g/dL (ref 13.0–17.7)
MCH: 29 pg (ref 26.6–33.0)
MCHC: 32.6 g/dL (ref 31.5–35.7)
MCV: 89 fL (ref 79–97)
Platelets: 257 10*3/uL (ref 150–450)
RBC: 5.03 x10E6/uL (ref 4.14–5.80)
RDW: 13.7 % (ref 11.6–15.4)
WBC: 6.2 10*3/uL (ref 3.4–10.8)

## 2020-03-12 LAB — HEMOGLOBIN A1C
Est. average glucose Bld gHb Est-mCnc: 117 mg/dL
Hgb A1c MFr Bld: 5.7 % — ABNORMAL HIGH (ref 4.8–5.6)

## 2020-04-13 ENCOUNTER — Ambulatory Visit: Payer: Self-pay | Admitting: Nurse Practitioner

## 2020-05-06 ENCOUNTER — Emergency Department (HOSPITAL_COMMUNITY)
Admission: EM | Admit: 2020-05-06 | Discharge: 2020-05-07 | Disposition: A | Discharge status: Home / Self Care | Payer: Self-pay | Attending: Emergency Medicine | Admitting: Emergency Medicine

## 2020-05-06 ENCOUNTER — Other Ambulatory Visit: Payer: Self-pay

## 2020-05-06 ENCOUNTER — Encounter (HOSPITAL_COMMUNITY): Payer: Self-pay

## 2020-05-06 DIAGNOSIS — Z5321 Procedure and treatment not carried out due to patient leaving prior to being seen by health care provider: Secondary | ICD-10-CM | POA: Insufficient documentation

## 2020-05-06 DIAGNOSIS — L02415 Cutaneous abscess of right lower limb: Secondary | ICD-10-CM | POA: Insufficient documentation

## 2020-05-06 NOTE — ED Triage Notes (Signed)
Pt has abscess to right knee for the past 3 months, intermittent drainage. Pt ambulatory

## 2020-05-07 NOTE — ED Notes (Signed)
Na x 2.

## 2020-05-27 ENCOUNTER — Other Ambulatory Visit: Payer: Self-pay

## 2020-05-27 ENCOUNTER — Encounter: Payer: Self-pay | Admitting: Nurse Practitioner

## 2020-05-27 ENCOUNTER — Ambulatory Visit: Payer: Self-pay | Attending: Nurse Practitioner | Admitting: Nurse Practitioner

## 2020-05-27 VITALS — BP 133/92 | HR 72 | Temp 97.7°F | Ht 68.5 in | Wt 243.0 lb

## 2020-05-27 DIAGNOSIS — I10 Essential (primary) hypertension: Secondary | ICD-10-CM

## 2020-05-27 DIAGNOSIS — R2241 Localized swelling, mass and lump, right lower limb: Secondary | ICD-10-CM

## 2020-05-27 MED ORDER — SULFAMETHOXAZOLE-TRIMETHOPRIM 800-160 MG PO TABS: 1.0000 | ORAL_TABLET | Freq: Two times a day (BID) | ORAL | 0 refills | Status: AC

## 2020-05-27 MED ORDER — AMLODIPINE BESYLATE 5 MG PO TABS: 5.0000 mg | ORAL_TABLET | Freq: Every day | ORAL | 1 refills | Status: DC

## 2020-05-27 MED FILL — SULFAMETHOXAZOLE-TMP DS TAB: 800-160 | 5 days supply | Qty: 10 | Fill #0

## 2020-05-27 MED FILL — AMLODIPINE BESYLATE 5 MG TA: 5 | 30 days supply | Qty: 30 | Fill #0
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
## 2020-05-30 ENCOUNTER — Encounter: Payer: Self-pay | Admitting: Nurse Practitioner

## 2020-06-12 ENCOUNTER — Ambulatory Visit (HOSPITAL_COMMUNITY): Admission: RE | Admit: 2020-06-12 | Payer: Self-pay | Source: Ambulatory Visit

## 2020-06-20 ENCOUNTER — Emergency Department (HOSPITAL_COMMUNITY): Payer: Self-pay

## 2020-06-20 ENCOUNTER — Other Ambulatory Visit: Payer: Self-pay

## 2020-06-20 ENCOUNTER — Emergency Department (HOSPITAL_COMMUNITY)
Admission: EM | Admit: 2020-06-20 | Discharge: 2020-06-20 | Disposition: A | Discharge status: Home / Self Care | Payer: Self-pay | Attending: Emergency Medicine | Admitting: Emergency Medicine

## 2020-06-20 DIAGNOSIS — R2242 Localized swelling, mass and lump, left lower limb: Secondary | ICD-10-CM

## 2020-06-20 DIAGNOSIS — F17210 Nicotine dependence, cigarettes, uncomplicated: Secondary | ICD-10-CM | POA: Insufficient documentation

## 2020-06-20 DIAGNOSIS — R2241 Localized swelling, mass and lump, right lower limb: Secondary | ICD-10-CM | POA: Insufficient documentation

## 2020-06-20 LAB — CK: Total CK: 494 U/L — ABNORMAL HIGH (ref 49–397)

## 2020-06-20 LAB — CBC WITH DIFFERENTIAL/PLATELET
Abs Immature Granulocytes: 0.04 10*3/uL (ref 0.00–0.07)
Basophils Absolute: 0 10*3/uL (ref 0.0–0.1)
Basophils Relative: 1 %
Eosinophils Absolute: 0.7 10*3/uL — ABNORMAL HIGH (ref 0.0–0.5)
Eosinophils Relative: 9 %
HCT: 42.6 % (ref 39.0–52.0)
Hemoglobin: 13.8 g/dL (ref 13.0–17.0)
Immature Granulocytes: 1 %
Lymphocytes Relative: 25 %
Lymphs Abs: 1.9 10*3/uL (ref 0.7–4.0)
MCH: 28.9 pg (ref 26.0–34.0)
MCHC: 32.4 g/dL (ref 30.0–36.0)
MCV: 89.1 fL (ref 80.0–100.0)
Monocytes Absolute: 0.7 10*3/uL (ref 0.1–1.0)
Monocytes Relative: 10 %
Neutro Abs: 4.1 10*3/uL (ref 1.7–7.7)
Neutrophils Relative %: 54 %
Platelets: 289 10*3/uL (ref 150–400)
RBC: 4.78 MIL/uL (ref 4.22–5.81)
RDW: 13.2 % (ref 11.5–15.5)
WBC: 7.4 10*3/uL (ref 4.0–10.5)
nRBC: 0 % (ref 0.0–0.2)

## 2020-06-20 LAB — COMPREHENSIVE METABOLIC PANEL
ALT: 26 U/L (ref 0–44)
AST: 29 U/L (ref 15–41)
Albumin: 3.7 g/dL (ref 3.5–5.0)
Alkaline Phosphatase: 67 U/L (ref 38–126)
Anion gap: 13 (ref 5–15)
BUN: 14 mg/dL (ref 6–20)
CO2: 20 mmol/L — ABNORMAL LOW (ref 22–32)
Calcium: 9.3 mg/dL (ref 8.9–10.3)
Chloride: 106 mmol/L (ref 98–111)
Creatinine, Ser: 0.98 mg/dL (ref 0.61–1.24)
GFR, Estimated: >60 mL/min (ref >60)
Glucose, Bld: 88 mg/dL (ref 70–99)
Potassium: 4.5 mmol/L (ref 3.5–5.1)
Sodium: 139 mmol/L (ref 135–145)
Total Bilirubin: 0.9 mg/dL (ref 0.3–1.2)
Total Protein: 7 g/dL (ref 6.5–8.1)

## 2020-06-20 MED ORDER — IOHEXOL 300 MG/ML  SOLN
100.0000 mL | Freq: Once | INTRAMUSCULAR | Status: AC | PRN
  Administered 2020-06-20: 100 mL via INTRAVENOUS

## 2020-06-20 NOTE — ED Provider Notes (Signed)
[facility] Provider Note   CSN: [id number] Arrival date & time: 06/20/20  1258     History Chief Complaint  Patient presents with  . Leg Injury    [name] CoreDevon Ternes is a 34 y.o. male past medical history of fracture of his tibia, polysubstance abuse who presents for evaluation of pain to his right lower extremity that has been ongoing for several months.  He states that about 6-7 months ago, he had scraped the knee and then noticed that there was a small growth of just distal to the right knee.  States he saw somebody at urgent care and was given antibiotics but states that the area continued to grow larger.  He states that over the last 4 to 5 months, he has had significant growth to the area.  He states that over the last week or so, has become more painful, has been draining.  He states that he can still ambulate and bear weight on the leg but does report that when he moves the leg, he feels like the pain is worse.  He has not noted any fevers.  He has not had any numbness/weakness.  He does smoke about half pack of cigarettes a day.  Denies any other illicit drug use.  Denies any IV drug use.  The history is provided by the patient.       Past Medical History:  Diagnosis Date  . Fracture of distal end of tibia 11/22/2014  . Fracture of right proximal fibula 11/23/2014  . History of gonorrhea    treated august 2015  . Polysubstance abuse [facility](HCC)     Patient Active Problem List   Diagnosis Date Noted  . Acute pericoronitis 12/26/2016  . Fracture of right proximal fibula 11/23/2014  . Polysubstance abuse (HCC)   . Fracture of distal end of tibia 11/22/2014    Past Surgical History:  Procedure Laterality Date  . CRPP left wrist     As a kid  . HARDWARE REMOVAL Right 04/28/2015   Procedure: HARDWARE REMOVAL RIGHT ANKLE, five screws 1 plate, and 1 washer removed;  Surgeon: Myrene GalasMichael Handy, MD;  [facility];  Service: Orthopedics;  Laterality:  Right;  . ORIF ANKLE FRACTURE Right 12/04/2014   Procedure: RIGHT OPEN REDUCTION INTERNAL FIXATION (ORIF) PILON FRACTURE;  Surgeon: Myrene GalasMichael Handy, MD;  [facility];  Service: Orthopedics;  Laterality: Right;  ANESTHESIA: GENERAL, REGIONAL BLOCK       Family History  Problem Relation Age of Onset  . Hypertension Mother   . Diabetes type II Mother   . Hypertension Father     Social History   Tobacco Use  . Smoking status: Current Every Day Smoker    Packs/day: 0.25    Years: 15.00    Pack years: 3.75    Types: Cigarettes  . Smokeless tobacco: Never Used  Substance Use Topics  . Alcohol use: Yes    Alcohol/week: 0.0 standard drinks    Comment: occasional  . Drug use: Yes    Types: Marijuana    Comment: Regular use last 8/08 16    Home Medications Prior to Admission medications   Medication Sig Start Date End Date Taking? Authorizing Provider  amLODipine (NORVASC) 5 MG tablet Take 1 tablet (5 mg total) by mouth daily. 05/27/20 06/26/20  Claiborne RiggFleming, Zelda W, NP    Allergies    Patient has no known allergies.  Review of Systems   Review of Systems  Constitutional: Negative for fever.  Cardiovascular: Negative for chest pain.  Musculoskeletal:       RLE pain  Skin: Positive for wound.  Neurological: Negative for weakness and numbness.  All other systems reviewed and are negative.   Physical Exam Updated Vital Signs BP 114/71 (BP Location: Right Arm)   Pulse 60   Temp 98.6 F (37 C) (Oral)   Resp 18   Ht 5\' 8"  (1.727 m)   Wt 127 kg   SpO2 100%   BMI 42.57 kg/m   Physical Exam Vitals and nursing note reviewed.  Constitutional:      Appearance: Normal appearance. He is well-developed.  HENT:     Head: Normocephalic and atraumatic.  Eyes:     General: Lids are normal.     Conjunctiva/sclera: Conjunctivae normal.     Pupils: Pupils are equal, round, and reactive to light.  Cardiovascular:     Rate and Rhythm: Normal rate and regular rhythm.     Pulses:  Normal pulses.          Radial pulses are 2+ on the right side and 2+ on the left side.       Dorsalis pedis pulses are 2+ on the right side and 2+ on the left side.     Heart sounds: Normal heart sounds. No murmur heard.  No friction rub. No gallop.   Pulmonary:     Effort: Pulmonary effort is normal.     Breath sounds: Normal breath sounds.  Abdominal:     Palpations: Abdomen is soft. Abdomen is not rigid.     Tenderness: There is no abdominal tenderness. There is no guarding.  Musculoskeletal:        General: Normal range of motion.     Cervical back: Full passive range of motion without pain.     Comments: There is a large, tennis sized ball mass noted to the anterior aspect of the proximal tib-fib, just distal to the knee.  It does not seem to involve the knee joint itself.  There is an area where the skin is broken and it is draining some purulent drainage.  No overlying warmth.  Flexion/tension of right knee intact by difficulty.  No bony tenderness noted to the right tib-fib.  Skin:    General: Skin is warm and dry.     Capillary Refill: Capillary refill takes less than 2 seconds.  Neurological:     Mental Status: He is alert and oriented to person, place, and time.     Comments: Sensation intact along major nerve distributions of RLE  Psychiatric:        Speech: Speech normal.             ED Results / Procedures / Treatments   Labs (all labs ordered are listed, but only abnormal results are displayed) Labs Reviewed  COMPREHENSIVE METABOLIC PANEL - Abnormal; Notable for the following components:      Result Value   CO2 20 (*)    All other components within normal limits  CBC WITH DIFFERENTIAL/PLATELET - Abnormal; Notable for the following components:   Eosinophils Absolute 0.7 (*)    All other components within normal limits  CK - Abnormal; Notable for the following components:   Total CK 494 (*)    All other components within normal limits     EKG None  Radiology DG Tibia/Fibula Right  Result Date: 06/20/2020 CLINICAL DATA:  34 year old male with right lower extremity pain. EXAM: RIGHT TIBIA AND FIBULA - 2 VIEW COMPARISON:  None. FINDINGS: There is no acute fracture or dislocation. The bones are well mineralized. Distal tibial fixation sideplate and screws. The hardware is intact. No evidence of loosening. Exophytic soft tissue mass from the anterior upper thigh measuring approximately 7 cm. Clinical correlation is recommended. IMPRESSION: 1. No acute fracture or dislocation. 2. Distal tibial fixation hardware appears intact. 3. Exophytic soft tissue mass from the anterior upper thigh. Clinical correlation is recommended. Electronically Signed   By: Elgie Collard M.D.   On: 06/20/2020 18:27    Procedures Procedures (including critical care time)  Medications Ordered in ED Medications  iohexol (OMNIPAQUE) 300 MG/ML solution 100 mL (100 mLs Intravenous Contrast Given 06/20/20 1952)    ED Course  I have reviewed the triage vital signs and the nursing notes.  Pertinent labs & imaging results that were available during my care of the patient were reviewed by me and considered in my medical decision making (see chart for details).    MDM Rules/Calculators/A&P                          34 year old male who presents for evaluation of gross right knee.  States is been ongoing issue for the last several months.  Felt like it is gotten worse over the last few weeks.  No fevers, numbness/weakness.  On initially arrival, he is afebrile, nontoxic-appearing.  Vital signs are stable.  On exam, he has full range of motion of the right knee with any difficulty.  He is a tennis ball sized growth noted to the anterior aspect of his right tib-fib just distal to the knee.  It does not seem to be involving the knee joint itself.  He has full range of motion of the knee without any difficulty.  Plan to check labs, imaging.  CBC shows no  leukocytosis or anemia.  CMP shows BUN of 14, creatinine 0.98.  CK is 494.  X-ray of his tib-fib shows intact hardware.  There is mass noted.  Will obtain CT scan with contrast for further evaluation.  CT shows a 6 x 3 x 7 cm soft tissue mass noted that is heterogenous in appearance and appears to be centered within the subcutaneous soft tissues.  The mass closely abuts the tibialis anterior muscle with questionable loss of intervening fat plane.  There is questionable periosteal reaction involving the medial proximal tibia.  He also has reactive lymph nodes as well.  I discussed with Dr. Sherlean Foot (orthopedic) regarding results.  Patient has been seen by Dr. Marcello Fennel previously and he is taking call for Dr. Carola Frost.  At this time, patient can be discharged.  He recommends that patient contact Dr. Andrew Au office on Monday or his office to arrange outpatient follow-up.  He states that patient will likely need [facility].  Discussed results with patient and significant other.  Patient instructed to contact orthopedic first thing Monday morning. At this time, patient exhibits no emergent life-threatening condition that require further evaluation in ED. Patient had ample opportunity for questions and discussion. All patient's questions were answered with full understanding. Strict return precautions discussed. Patient expresses understanding and agreement to plan.   Portions of this note were generated with Scientist, clinical (histocompatibility and immunogenetics). Dictation errors may occur despite best attempts at proofreading.  Final Clinical Impression(s) / ED Diagnoses Final diagnoses:  Mass of left lower leg    Rx / DC Orders ED Discharge Orders    None  Maxwell Caul, PA-C 06/20/20 2344    Charlynne Pander, MD 06/24/20 [phone]

## 2020-06-20 NOTE — Discharge Instructions (Signed)
As we discussed, your work-up today shows that there is a soft tissue mass noted on the left lower extremity.  This needs to be further evaluated by biopsy and other evaluations to determine what exactly is but as we discussed, this could be concerning a tissue sarcoma.  You need to follow-up with the referred orthopedic doctor.  You need to call Dr. Magdalene Patricia office on Monday morning.  He can also contact Dr. Tobin Chad office.  Return the emergency department for any worsening pain, difficulty walking, fevers or any other worsening concerning symptoms.
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
## 2020-06-22 ENCOUNTER — Ambulatory Visit: Payer: Medicaid Other | Admitting: Pharmacist

## 2020-12-21 ENCOUNTER — Ambulatory Visit: Payer: Medicaid Other

## 2021-03-30 ENCOUNTER — Ambulatory Visit: Payer: Medicaid Other | Admitting: Nurse Practitioner

## 2021-04-03 ENCOUNTER — Emergency Department (HOSPITAL_COMMUNITY)
Admission: EM | Admit: 2021-04-03 | Discharge: 2021-04-03 | Disposition: A | Discharge status: Home / Self Care | Payer: Medicaid Other | Attending: Emergency Medicine | Admitting: Emergency Medicine

## 2021-04-03 ENCOUNTER — Other Ambulatory Visit: Payer: Self-pay

## 2021-04-03 DIAGNOSIS — R369 Urethral discharge, unspecified: Secondary | ICD-10-CM

## 2021-04-03 DIAGNOSIS — F17210 Nicotine dependence, cigarettes, uncomplicated: Secondary | ICD-10-CM | POA: Insufficient documentation

## 2021-04-03 MED ORDER — DOXYCYCLINE HYCLATE 100 MG PO TABS
100.0000 mg | ORAL_TABLET | Freq: Once | ORAL | Status: AC
  Administered 2021-04-03: 100 mg via ORAL
  Filled 2021-04-03: qty 1

## 2021-04-03 MED ORDER — STERILE WATER FOR INJECTION IJ SOLN
INTRAMUSCULAR | Status: AC
  Administered 2021-04-03: 10 mL
  Filled 2021-04-03: qty 10

## 2021-04-03 MED ORDER — DOXYCYCLINE HYCLATE 100 MG PO CAPS: 100.0000 mg | ORAL_CAPSULE | Freq: Two times a day (BID) | ORAL | 0 refills | Status: AC

## 2021-04-03 MED ORDER — LIDOCAINE HCL (PF) 1 % IJ SOLN: 1.0000 mL | Freq: Once | INTRAMUSCULAR | Status: DC

## 2021-04-03 MED ORDER — CEFTRIAXONE SODIUM 500 MG IJ SOLR
500.0000 mg | Freq: Once | INTRAMUSCULAR | Status: AC
  Administered 2021-04-03: 500 mg via INTRAMUSCULAR
  Filled 2021-04-03: qty 500

## 2021-04-03 NOTE — ED Triage Notes (Signed)
Pt reports 9 days of puss from penis, hematuria, and burning with urination.
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
## 2021-04-03 NOTE — ED Provider Notes (Signed)
[facility] EMERGENCY DEPARTMENT Provider Note   CSN: [id number] Arrival date & time: 04/03/21  1055     History No chief complaint on file.   [name] Hart is a 35 y.o. male presenting for evaluation of penile discharge.  Patient states the past 9 days he has had penile discharge, dysuria, and urinary frequency.  He denies fevers, chills, nausea, vomiting, Donnell pain.  He has been in jail for the past 8 days, inhibiting his care.  He states he was given pills, may be antibiotics, but does not know which ones or what for.  He was not given any shots nor tested for anything.  He has a history of gonorrhea in the past, states this feels similar.  He reports no other medical problems, takes no medications daily.  HPI     Past Medical History:  Diagnosis Date   Fracture of distal end of tibia 11/22/2014   Fracture of right proximal fibula 11/23/2014   History of gonorrhea    treated august 2015   Polysubstance abuse [facility])     Patient Active Problem List   Diagnosis Date Noted   Acute pericoronitis 12/26/2016   Fracture of right proximal fibula 11/23/2014   Polysubstance abuse (HCC)    Fracture of distal end of tibia 11/22/2014    Past Surgical History:  Procedure Laterality Date   CRPP left wrist     As a kid   HARDWARE REMOVAL Right 04/28/2015   Procedure: HARDWARE REMOVAL RIGHT ANKLE, five screws 1 plate, and 1 washer removed;  Surgeon: Myrene Galas, MD;  Location: [facility] OR;  Service: Orthopedics;  Laterality: Right;   ORIF ANKLE FRACTURE Right 12/04/2014   Procedure: RIGHT OPEN REDUCTION INTERNAL FIXATION (ORIF) PILON FRACTURE;  Surgeon: Myrene Galas, MD;  Location: [facility] OR;  Service: Orthopedics;  Laterality: Right;  ANESTHESIA: GENERAL, REGIONAL BLOCK       Family History  Problem Relation Age of Onset   Hypertension Mother    Diabetes type II Mother    Hypertension Father     Social History   Tobacco Use   Smoking status: Every Day     Packs/day: 0.25    Years: 15.00    Pack years: 3.75    Types: Cigarettes   Smokeless tobacco: Never  Substance Use Topics   Alcohol use: Yes    Alcohol/week: 0.0 standard drinks    Comment: occasional   Drug use: Yes    Types: Marijuana    Comment: Regular use last 8/08 16    Home Medications Prior to Admission medications   Medication Sig Start Date End Date Taking? Authorizing Provider  doxycycline (VIBRAMYCIN) 100 MG capsule Take 1 capsule (100 mg total) by mouth 2 (two) times daily for 7 days. 04/03/21 04/10/21 Yes Tasman Zapata, PA-C  amLODipine (NORVASC) 5 MG tablet Take 1 tablet (5 mg total) by mouth daily. 05/27/20 06/26/20  Claiborne Rigg, NP    Allergies    Patient has no known allergies.  Review of Systems   Review of Systems  Constitutional:  Negative for fever.  Genitourinary:  Positive for dysuria, frequency and penile discharge.   Physical Exam Updated Vital Signs BP (!) 143/100   Pulse 98   Temp 98.8 F (37.1 C) (Oral)   Resp 16   SpO2 99%   Physical Exam Vitals and nursing note reviewed. Exam conducted with a chaperone present.  Constitutional:      General: He is not in acute distress.  Appearance: He is well-developed.  HENT:     Head: Normocephalic and atraumatic.  Eyes:     Extraocular Movements: Extraocular movements intact.  Cardiovascular:     Rate and Rhythm: Normal rate.  Pulmonary:     Effort: Pulmonary effort is normal.  Abdominal:     General: There is no distension.     Palpations: There is no mass.     Tenderness: There is no abdominal tenderness. There is no guarding or rebound.  Genitourinary:    Penis: Uncircumcised. Discharge present.      Comments: Purulent discharge noted at the tip of the penis.  No epididymal pain.  No inguinal lymphadenopathy. Musculoskeletal:        General: Normal range of motion.     Cervical back: Normal range of motion.  Skin:    General: Skin is warm.     Capillary Refill: Capillary  refill takes less than 2 seconds.     Findings: No rash.  Neurological:     Mental Status: He is alert and oriented to person, place, and time.    ED Results / Procedures / Treatments   Labs (all labs ordered are listed, but only abnormal results are displayed) Labs Reviewed  GC/CHLAMYDIA PROBE AMP ([place]) NOT AT [facility]    EKG None  Radiology No results found.  Procedures Procedures   Medications Ordered in ED Medications  cefTRIAXone (ROCEPHIN) injection 500 mg (has no administration in time range)  lidocaine (PF) (XYLOCAINE) 1 % injection 1 mL (has no administration in time range)  doxycycline (VIBRA-TABS) tablet 100 mg (has no administration in time range)    ED Course  I have reviewed the triage vital signs and the nursing notes.  Pertinent labs & imaging results that were available during my care of the patient were reviewed by me and considered in my medical decision making (see chart for details).    MDM Rules/Calculators/A&P                           Patient resenting for evaluation of penile discharge and urinary symptoms.  On exam, patient is purulent discharge from the penis.  No abdominal pain.  Vitals are overall reassuring, doubt sepsis.  Will treat for infection as patient has symptoms.  Discussed importance of informing partners and abstain from sex to prevent reinfection.  Follow-up with health department as needed.  At this time, patient appears safe for discharge.  Return precautions given.  Patient states he understands and agrees to plan.  Final Clinical Impression(s) / ED Diagnoses Final diagnoses:  Penile discharge    Rx / DC Orders ED Discharge Orders          Ordered    doxycycline (VIBRAMYCIN) 100 MG capsule  2 times daily        04/03/21 1125             Alveria Apley, PA-C 04/03/21 1128    Cheryll Cockayne, MD 04/04/21 1105

## 2021-04-05 LAB — GC/CHLAMYDIA PROBE AMP (~~LOC~~) NOT AT ARMC
Chlamydia: NEGATIVE
Comment: NEGATIVE
Comment: NORMAL
Neisseria Gonorrhea: POSITIVE — AB

## 2022-01-03 ENCOUNTER — Encounter (HOSPITAL_COMMUNITY): Payer: Self-pay

## 2022-01-03 ENCOUNTER — Emergency Department (HOSPITAL_COMMUNITY)
Admission: EM | Admit: 2022-01-03 | Discharge: 2022-01-04 | Disposition: A | Discharge status: Home / Self Care | Payer: Self-pay | Attending: Emergency Medicine | Admitting: Emergency Medicine

## 2022-01-03 ENCOUNTER — Other Ambulatory Visit: Payer: Self-pay

## 2022-01-03 DIAGNOSIS — L089 Local infection of the skin and subcutaneous tissue, unspecified: Secondary | ICD-10-CM | POA: Insufficient documentation

## 2022-01-03 DIAGNOSIS — R229 Localized swelling, mass and lump, unspecified: Secondary | ICD-10-CM

## 2022-01-03 LAB — BASIC METABOLIC PANEL
Anion gap: 9 (ref 5–15)
BUN: 17 mg/dL (ref 6–20)
CO2: 23 mmol/L (ref 22–32)
Calcium: 9.6 mg/dL (ref 8.9–10.3)
Chloride: 104 mmol/L (ref 98–111)
Creatinine, Ser: 0.95 mg/dL (ref 0.61–1.24)
GFR, Estimated: >60 mL/min (ref >60)
Glucose, Bld: 123 mg/dL — ABNORMAL HIGH (ref 70–99)
Potassium: 3.9 mmol/L (ref 3.5–5.1)
Sodium: 136 mmol/L (ref 135–145)

## 2022-01-03 LAB — CBC WITH DIFFERENTIAL/PLATELET
Abs Immature Granulocytes: 0.02 10*3/uL (ref 0.00–0.07)
Basophils Absolute: 0 10*3/uL (ref 0.0–0.1)
Basophils Relative: 1 %
Eosinophils Absolute: 0.7 10*3/uL — ABNORMAL HIGH (ref 0.0–0.5)
Eosinophils Relative: 8 %
HCT: 43.4 % (ref 39.0–52.0)
Hemoglobin: 14.4 g/dL (ref 13.0–17.0)
Immature Granulocytes: 0 %
Lymphocytes Relative: 26 %
Lymphs Abs: 2.1 10*3/uL (ref 0.7–4.0)
MCH: 28.6 pg (ref 26.0–34.0)
MCHC: 33.2 g/dL (ref 30.0–36.0)
MCV: 86.3 fL (ref 80.0–100.0)
Monocytes Absolute: 0.6 10*3/uL (ref 0.1–1.0)
Monocytes Relative: 7 %
Neutro Abs: 4.5 10*3/uL (ref 1.7–7.7)
Neutrophils Relative %: 58 %
Platelets: 381 10*3/uL (ref 150–400)
RBC: 5.03 MIL/uL (ref 4.22–5.81)
RDW: 14 % (ref 11.5–15.5)
WBC: 7.9 10*3/uL (ref 4.0–10.5)
nRBC: 0 % (ref 0.0–0.2)

## 2022-01-03 LAB — LACTIC ACID, PLASMA: Lactic Acid, Venous: 1.5 mmol/L (ref 0.5–1.9)

## 2022-01-03 NOTE — ED Provider Triage Note (Signed)
Emergency Medicine Provider Triage Evaluation Note ? ?[facility] , a 36 y.o. male  was evaluated in triage.  Pt complains of wound to right lower leg.  Patient reports that wound has been present for multiple years.  Patient had a small cut that "did not heal right."  Patient has had progressively worsening pain over the last year.  Over the last few weeks patient has had yellow thick discharge coming from the wound.  Endorses associated swelling to right lower extremity. ? ?Review of Systems  ?Positive: Wounds, swelling, pain ?Negative: Fever, chills, numbness, weakness, ? ?Physical Exam  ?BP (!) 149/100 (BP Location: Right Arm)   Pulse 85   Temp 99 ?F (37.2 ?C) (Oral)   Resp 16   Ht 5\' 7"  (1.702 m)   Wt 113.4 kg   SpO2 99%   BMI 39.16 kg/m?  ?Gen:   Awake, no distress   ?Resp:  Normal effort  ?MSK:   Moves extremities without difficulty  ?Other:  Patient has a large wound to right lower leg inferior to right knee as pictured below. ? ? ? ? ?Medical Decision Making  ?Medically screening exam initiated at 9:43 PM.  Appropriate orders placed.  [facility] was informed that the remainder of the evaluation will be completed by another provider, this initial triage assessment does not replace that evaluation, and the importance of remaining in the ED until their evaluation is complete. ? ?Due to recent change in discharge concern for possible infection.  Will obtain CMP, CBC, and lactic acid.  Discussed imaging with attending physician Dr. [facility].  Will obtain MRI imaging with and without contrast. ?  ?Effie Shy, PA-C ?01/03/22 2148 ? ?

## 2022-01-03 NOTE — ED Triage Notes (Signed)
Pt has wound to right leg. Wound has been present for years but over the last weeks it has become more swollen, red , more drainage and more painful. Pedal pulses present to right foot ?

## 2022-01-04 ENCOUNTER — Encounter (HOSPITAL_COMMUNITY): Payer: Self-pay

## 2022-01-04 ENCOUNTER — Emergency Department (HOSPITAL_COMMUNITY): Payer: Self-pay

## 2022-01-04 ENCOUNTER — Other Ambulatory Visit: Payer: Self-pay | Admitting: *Deleted

## 2022-01-04 DIAGNOSIS — R2241 Localized swelling, mass and lump, right lower limb: Secondary | ICD-10-CM

## 2022-01-04 MED ORDER — HYDROCODONE-ACETAMINOPHEN 5-325 MG PO TABS: 1.0000 | ORAL_TABLET | ORAL | 0 refills | Status: DC | PRN

## 2022-01-04 MED ORDER — GADOBUTROL 1 MMOL/ML IV SOLN
10.0000 mL | Freq: Once | INTRAVENOUS | Status: AC | PRN
  Administered 2022-01-04: 10 mL via INTRAVENOUS

## 2022-01-04 MED ORDER — DOXYCYCLINE HYCLATE 100 MG PO CAPS: 100.0000 mg | ORAL_CAPSULE | Freq: Two times a day (BID) | ORAL | 0 refills | Status: DC

## 2022-01-04 MED ORDER — OXYCODONE-ACETAMINOPHEN 5-325 MG PO TABS
2.0000 | ORAL_TABLET | Freq: Once | ORAL | Status: AC
  Administered 2022-01-04: 2 via ORAL
  Filled 2022-01-04: qty 2

## 2022-01-04 NOTE — ED Provider Notes (Signed)
?[facility] EMERGENCY DEPARTMENT ?Provider Note ? ? ?CSN: [id number] ?Arrival date & time: 01/03/22  2005 ? ?  ? ?History ? ?Chief Complaint  ?Patient presents with  ? Wound Infection  ? ? ?[name] Hart is a 36 y.o. male. ? ?Pt complains of having a open wound to his leg.  Pt reports area started after he scraped his knee.  Pt was seen here 1.5 years ago and had a ct.  Pt was advised to follow up at [facility].  Pt reports he ws unable to get seen.  Pt reports area is becoming painful.  Pt reports he is now having trouble moving his knee.  Pt reports he is having difficulty doing his job.  Pt denies any fever or chills.  Wound is draining ? ?The history is provided by the patient. No language interpreter was used.  ? ?  ? ?Home Medications ?Prior to Admission medications   ?Medication Sig Start Date End Date Taking? Authorizing Provider  ?amLODipine (NORVASC) 5 MG tablet Take 1 tablet (5 mg total) by mouth daily. 05/27/20 06/26/20  Claiborne Rigg, NP  ?   ? ?Allergies    ?Patient has no known allergies.   ? ?Review of Systems   ?Review of Systems  ?All other systems reviewed and are negative. ? ?Physical Exam ?Updated Vital Signs ?BP (!) 143/82   Pulse (!) 57   Temp 98.2 ?F (36.8 ?C) (Oral)   Resp 15   Ht 5\' 7"  (1.702 m)   Wt 113.4 kg   SpO2 99%   BMI 39.16 kg/m?  ?Physical Exam ?Vitals and nursing note reviewed.  ?Constitutional:   ?   General: He is not in acute distress. ?   Appearance: He is well-developed.  ?HENT:  ?   Head: Normocephalic and atraumatic.  ?Eyes:  ?   Conjunctiva/sclera: Conjunctivae normal.  ?Cardiovascular:  ?   Rate and Rhythm: Normal rate and regular rhythm.  ?   Heart sounds: No murmur heard. ?Pulmonary:  ?   Effort: Pulmonary effort is normal. No respiratory distress.  ?Musculoskeletal:     ?   General: No swelling.  ?   Cervical back: Neck supple.  ?   Comments: Large mass right leg approx 12 cm round   ?Skin: ?   General: Skin is warm.  ?   Capillary Refill:  Capillary refill takes less than 2 seconds.  ?   Comments: 12x15 cm mass just below right knee,  from nv and ns intact  leg has scattered dark splotches below   ?Neurological:  ?   Mental Status: He is alert.  ?Psychiatric:     ?   Mood and Affect: Mood normal.  ? ? ?ED Results / Procedures / Treatments   ?Labs ?(all labs ordered are listed, but only abnormal results are displayed) ?Labs Reviewed  ?BASIC METABOLIC PANEL - Abnormal; Notable for the following components:  ?    Result Value  ? Glucose, Bld 123 (*)   ? All other components within normal limits  ?CBC WITH DIFFERENTIAL/PLATELET - Abnormal; Notable for the following components:  ? Eosinophils Absolute 0.7 (*)   ? All other components within normal limits  ?LACTIC ACID, PLASMA  ? ? ?EKG ?None ? ?Radiology ?MR TIBIA FIBULA RIGHT W WO CONTRAST ? ?Result Date: 01/04/2022 ?CLINICAL DATA:  Right lower extremity pain. Chronic wound. Large exophytic mass seen on CT scan from 06/20/2020. EXAM: MRI OF LOWER RIGHT EXTREMITY WITHOUT AND WITH CONTRAST TECHNIQUE:  Multiplanar, multisequence MR imaging of the right lower extremity was performed both before and after administration of intravenous contrast. CONTRAST:  10mL GADAVIST GADOBUTROL 1 MMOL/ML IV SOLN COMPARISON:  CT scan 06/20/2020 FINDINGS: There is a large exophytic enhancing soft tissue mass involving the inferior aspect of the right knee laterally. Lesion measures approximately 10.0 x 8.5 x 5.0 cm. On the prior CT scan from 2021 and measured 7.0 x 5.9 x 3.4 cm. The lesion involves the subcutaneous tissues and dermis and abuts but does not definitely involve the underlying muscle fascia. There is inflammation/edema and vascularity around the lesion but I do not see any direct invasion of the lateral retinaculum or patellar tendon. No erosive bony changes. There is some edema like signal change and enhancement in the anterior tibialis muscle which could be reactive change. No obvious enhancing lesion in the  muscle. Findings are most worrisome for a neoplastic process associated with a chronic wound including squamous cell carcinoma, basal cell carcinoma and sarcoma. Biopsy is strongly recommended. IMPRESSION: 1. 10.0 x 8.5 x 5.0 cm exophytic enhancing soft tissue mass involving the inferior aspect of the right knee laterally. This measures 7.0 x 5.9 x 3.4 cm on prior CT scan from 2021. Findings worrisome for a neoplastic process associated with a chronic wound including squamous cell carcinoma, basal cell carcinoma and sarcoma. Biopsy is strongly recommended. 2. Mild edema like signal change and enhancement in the anterior tibialis muscle which could be reactive change. No definite direct invasion of the muscle or bone. Electronically Signed   By: Rudie MeyerP.  Gallerani M.D.   On: 01/04/2022 11:01   ? ?Procedures ?Procedures  ? ? ?Medications Ordered in ED ?Medications  ?oxyCODONE-acetaminophen (PERCOCET/ROXICET) 5-325 MG per tablet 2 tablet (2 tablets Oral Given 01/04/22 0847)  ? ? ? ?ED Course/ Medical Decision Making/ A&P ?  ?                        ?Medical Decision Making ?Amount and/or Complexity of Data Reviewed ?Independent Historian: spouse ?   Details: Pt's spouse will encourage pt with follow up ?External Data Reviewed: notes. ?   Details: Previous ed record and Orthopaedist records reviewed ?Labs: ordered. Decision-making details documented in ED Course. ?   Details: Lactic acid is normal.  Cbc is normal ?Radiology: ordered and independent interpretation performed. Decision-making details documented in ED Course. ?   Details: MRI shows 10 cm mass concerning for neoplastic processsarcoma vs basil cell. ?Discussion of management or test interpretation with external provider(s): Dale DurhamMichael Jeffries [facility]   ?I discussed pt with Dr. Truett PernaSherrill.  He advised pt needs a biopsy and he will schedule appointment for treatment. ?I discussed with general surgery  Dr. Magnus IvanBlackman and Bailey MechLiz Simaan PA  who spoke to Dr. Carola FrostHandy who advised that  ? He needs to see either Dr. Dahlia ByesScott Culler Wilson or Dr. Toy Bakerynthia Emory of ortho cancer center [phone] ?Pt counseled at length regarding concerns that this is a cancerous tumor.  Pt understands risk of not following up as directed.  ? ?Risk ?Prescription drug management. ? ? ? ? ? ? ? ? ? ? ?Final Clinical Impression(s) / ED Diagnoses ?Final diagnoses:  ?Skin mass  ?Skin infection  ? ? ?Rx / DC Orders ?ED Discharge Orders   ? ?      Ordered  ?  doxycycline (VIBRAMYCIN) 100 MG capsule  2 times daily       ?  01/04/22 1250  ?  HYDROcodone-acetaminophen (NORCO/VICODIN) 5-325 MG tablet  Every 4 hours PRN       ? 01/04/22 1250  ? ?  ?  ? ?  ?An After Visit Summary was printed and given to the patient.  ? ?  ?Elson Areas, [place] ?01/04/22 1258 ? ?  ?Ernie Avena, MD ?01/04/22 1739 ? ?

## 2022-01-04 NOTE — ED Notes (Signed)
Patient transported to MRI 

## 2022-01-04 NOTE — Discharge Instructions (Signed)
Call [phone] to schedule to be seen.  The mass in your leg is concerning for a sarcoma or a basil cell carcinoma.   ?

## 2022-01-04 NOTE — Progress Notes (Unsigned)
Referral placed for Medical oncology ?

## 2022-01-06 ENCOUNTER — Telehealth: Payer: Self-pay | Admitting: Physician Assistant

## 2022-01-06 NOTE — Telephone Encounter (Signed)
Scheduled appt per 4/25 referral. Pt is aware of appt date and time. Pt is aware to arrive 15 mins prior to appt time and to bring and updated insurance card. Pt is aware of appt location.   ?

## 2022-01-07 ENCOUNTER — Inpatient Hospital Stay: Payer: Medicaid Other

## 2022-01-07 ENCOUNTER — Other Ambulatory Visit: Payer: Self-pay

## 2022-01-07 ENCOUNTER — Encounter: Payer: Self-pay | Admitting: Physician Assistant

## 2022-01-07 ENCOUNTER — Inpatient Hospital Stay: Payer: Self-pay | Attending: Physician Assistant | Admitting: Physician Assistant

## 2022-01-07 VITALS — BP 139/96 | HR 72 | Temp 98.1°F | Resp 16 | Wt 255.3 lb

## 2022-01-07 DIAGNOSIS — R2241 Localized swelling, mass and lump, right lower limb: Secondary | ICD-10-CM

## 2022-01-07 LAB — CMP (CANCER CENTER ONLY)
ALT: 18 U/L (ref 0–44)
AST: 14 U/L — ABNORMAL LOW (ref 15–41)
Albumin: 4.1 g/dL (ref 3.5–5.0)
Alkaline Phosphatase: 75 U/L (ref 38–126)
Anion gap: 6 (ref 5–15)
BUN: 18 mg/dL (ref 6–20)
CO2: 24 mmol/L (ref 22–32)
Calcium: 9.4 mg/dL (ref 8.9–10.3)
Chloride: 107 mmol/L (ref 98–111)
Creatinine: 0.97 mg/dL (ref 0.61–1.24)
GFR, Estimated: >60 mL/min (ref >60)
Glucose, Bld: 112 mg/dL — ABNORMAL HIGH (ref 70–99)
Potassium: 4.1 mmol/L (ref 3.5–5.1)
Sodium: 137 mmol/L (ref 135–145)
Total Bilirubin: 0.2 mg/dL — ABNORMAL LOW (ref 0.3–1.2)
Total Protein: 7.8 g/dL (ref 6.5–8.1)

## 2022-01-07 LAB — CBC WITH DIFFERENTIAL (CANCER CENTER ONLY)
Abs Immature Granulocytes: 0.02 10*3/uL (ref 0.00–0.07)
Basophils Absolute: 0 10*3/uL (ref 0.0–0.1)
Basophils Relative: 1 %
Eosinophils Absolute: 0.7 10*3/uL — ABNORMAL HIGH (ref 0.0–0.5)
Eosinophils Relative: 11 %
HCT: 40.9 % (ref 39.0–52.0)
Hemoglobin: 13.3 g/dL (ref 13.0–17.0)
Immature Granulocytes: 0 %
Lymphocytes Relative: 33 %
Lymphs Abs: 2 10*3/uL (ref 0.7–4.0)
MCH: 27.8 pg (ref 26.0–34.0)
MCHC: 32.5 g/dL (ref 30.0–36.0)
MCV: 85.4 fL (ref 80.0–100.0)
Monocytes Absolute: 0.6 10*3/uL (ref 0.1–1.0)
Monocytes Relative: 10 %
Neutro Abs: 2.8 10*3/uL (ref 1.7–7.7)
Neutrophils Relative %: 45 %
Platelet Count: 313 10*3/uL (ref 150–400)
RBC: 4.79 MIL/uL (ref 4.22–5.81)
RDW: 14 % (ref 11.5–15.5)
WBC Count: 6.2 10*3/uL (ref 4.0–10.5)
nRBC: 0 % (ref 0.0–0.2)

## 2022-01-07 LAB — LACTATE DEHYDROGENASE: LDH: 117 U/L (ref 98–192)

## 2022-01-07 MED ORDER — OXYCODONE HCL 10 MG PO TABS: 10.0000 mg | ORAL_TABLET | ORAL | 0 refills | Status: DC | PRN

## 2022-01-07 NOTE — Progress Notes (Signed)
?[facility] ?[facility] ?[phone]   [phone] ? ?INITIAL CONSULTATION: ? ?Patient Care Team: ?Claiborne Rigg, NP as PCP - General (Nurse Practitioner) ? ?CHIEF COMPLAINTS/PURPOSE OF CONSULTATION:  ?Right knee soft tissue mass ? ?HISTORY OF PRESENTING ILLNESS:  ?[name] Hart 36 y.o. male with medical history significant for distal tibia and proximal fibular fracture s/p ORIF.  ? ?On review of the previous records, Mr. Llerena presented to the ED in October 2021 due right knee pain secondary to a growing mass for the last 6-7 months. He was recommended to see an orthopedic surgeon but patient did not follow up. He returned to the ED on 01/04/2022 due to worsening right knee pain and a significantly larger mass of the right knee. MRI  ? ?On exam today, Mr. Lieurance reports worsening pain originating from the right knee. He is currently taking Norco every 4 hours with no improvement of pain. Pain is caused predominantly with ambulation and movement of right leg. He reports some purulent discharge coming from the soft tissue mass.  He denies any changes to his appetite or energy levels. He is able to complete his daily activities on his own. He denies nausea, vomiting or abdominal pain. His bowel habits are unchanged. He denies easy bruising or signs of active bleeding. He denies fevers, chills, night sweats, shortness of breath, chest pain or cough. He has no other complaints. Rest of the 10 point ROS is below.  ? ?MEDICAL HISTORY:  ?Past Medical History:  ?Diagnosis Date  ? Fracture of distal end of tibia 11/22/2014  ? Fracture of right proximal fibula 11/23/2014  ? History of gonorrhea   ? treated august 2015  ? Polysubstance abuse (HCC)   ? ? ?SURGICAL HISTORY: ?Past Surgical History:  ?Procedure Laterality Date  ? CRPP left wrist    ? As a kid  ? HARDWARE REMOVAL Right 04/28/2015  ? Procedure: HARDWARE REMOVAL RIGHT ANKLE, five screws 1 plate, and 1 washer  removed;  Surgeon: Myrene Galas, MD;  Location: [facility] OR;  Service: Orthopedics;  Laterality: Right;  ? ORIF ANKLE FRACTURE Right 12/04/2014  ? Procedure: RIGHT OPEN REDUCTION INTERNAL FIXATION (ORIF) PILON FRACTURE;  Surgeon: Myrene Galas, MD;  Location: [facility] OR;  Service: Orthopedics;  Laterality: Right;  ANESTHESIA: GENERAL, REGIONAL BLOCK  ? ? ?SOCIAL HISTORY: ?Social History  ? ?Socioeconomic History  ? Marital status: Single  ?  Spouse name: Not on file  ? Number of children: Not on file  ? Years of education: Not on file  ? Highest education level: Not on file  ?Occupational History  ? Not on file  ?Tobacco Use  ? Smoking status: Every Day  ?  Packs/day: 0.50  ?  Years: 15.00  ?  Pack years: 7.50  ?  Types: Cigarettes  ? Smokeless tobacco: Never  ?Substance and Sexual Activity  ? Alcohol use: Not Currently  ?  Alcohol/week: 20.0 standard drinks  ?  Types: 20 Cans of beer per week  ? Drug use: Yes  ?  Types: Marijuana  ? Sexual activity: Yes  ?Other Topics Concern  ? Not on file  ?Social History Narrative  ? Employed by a temp agency  ? ?Social Determinants of Health  ? ?Financial Resource Strain: Not on file  ?Food Insecurity: Not on file  ?Transportation Needs: Not on file  ?Physical Activity: Not on file  ?Stress: Not on file  ?Social Connections: Not on file  ?Intimate Partner Violence: Not on file  ? ? ?  FAMILY HISTORY: ?Family History  ?Problem Relation Age of Onset  ? Hypertension Mother   ? Diabetes type II Mother   ? Hypertension Father   ? Cancer Maternal Grandfather   ? ? ?ALLERGIES:  has No Known Allergies. ? ?MEDICATIONS:  ?Current Outpatient Medications  ?Medication Sig Dispense Refill  ? oxyCODONE 10 MG TABS Take 1 tablet (10 mg total) by mouth every 4 (four) hours as needed for severe pain. 90 tablet 0  ? amLODipine (NORVASC) 5 MG tablet Take 1 tablet (5 mg total) by mouth daily. 30 tablet 1  ? doxycycline (VIBRA-TABS) 100 MG tablet Take 1 tablet (100 mg total) by mouth 2 (two) times daily. 20  tablet 0  ? morphine (MS CONTIN) 15 MG 12 hr tablet Take 1 tablet (15 mg total) by mouth every 12 (twelve) hours. 60 tablet 0  ? ?No current facility-administered medications for this visit.  ? ? ?REVIEW OF SYSTEMS:   ?Constitutional: ( - ) fevers, ( - )  chills , ( - ) night sweats ?Eyes: ( - ) blurriness of vision, ( - ) double vision, ( - ) watery eyes ?Ears, nose, mouth, throat, and face: ( - ) mucositis, ( - ) sore throat ?Respiratory: ( - ) cough, ( - ) dyspnea, ( - ) wheezes ?Cardiovascular: ( - ) palpitation, ( - ) chest discomfort, ( - ) lower extremity swelling ?Gastrointestinal:  ( - ) nausea, ( - ) heartburn, ( - ) change in bowel habits ?Skin: ( - ) abnormal skin rashes ?Lymphatics: ( - ) new lymphadenopathy, ( - ) easy bruising ?Neurological: ( - ) numbness, ( - ) tingling, ( - ) new weaknesses ?Behavioral/Psych: ( - ) mood change, ( - ) new changes  ?All other systems were reviewed with the patient and are negative. ? ?PHYSICAL EXAMINATION: ?ECOG PERFORMANCE STATUS: 1 - Symptomatic but completely ambulatory ? ?Vitals:  ? 01/07/22 1220  ?BP: (!) 139/96  ?Pulse: 72  ?Resp: 16  ?Temp: 98.1 ?F (36.7 ?C)  ?SpO2: 100%  ? ?Filed Weights  ? 01/07/22 1220  ?Weight: 255 lb 4.8 oz (115.8 kg)  ? ? ?GENERAL: well appearing male in NAD  ?SKIN: skin color, texture, turgor are normal, no rashes or significant lesions ?EYES: conjunctiva are pink and non-injected, sclera clear ?OROPHARYNX: no exudate, no erythema; lips, buccal mucosa, and tongue normal  ?NECK: supple, non-tender ?LYMPH:  no palpable lymphadenopathy in the cervical or supraclavicular lymph nodes.  ?LUNGS: clear to auscultation and percussion with normal breathing effort ?HEART: regular rate & rhythm and no murmurs and no lower extremity edema ?ABDOMEN: soft, non-tender, non-distended, normal bowel sounds ?Musculoskeletal: no cyanosis of digits and no clubbing. Large soft tissue mass involving the right knee with some purulent discharge. Soft palpable  mass involving the right upper back.  ?PSYCH: alert & oriented x 3, fluent speech ?NEURO: no focal motor/sensory deficits ? ?LABORATORY DATA:  ?I have reviewed the data as listed ? ?  Latest Ref Rng & Units 01/07/2022  ?  2:03 PM 01/03/2022  ? 10:14 PM 06/20/2020  ?  4:36 PM  ?CBC  ?WBC 4.0 - 10.5 K/uL 6.2   7.9   7.4    ?Hemoglobin 13.0 - 17.0 g/dL 16.113.3   09.614.4   04.513.8    ?Hematocrit 39.0 - 52.0 % 40.9   43.4   42.6    ?Platelets 150 - 400 K/uL 313   381   289    ? ? ? ?  Latest Ref Rng & Units 01/07/2022  ?  2:03 PM 01/03/2022  ? 10:14 PM 06/20/2020  ?  4:36 PM  ?CMP  ?Glucose 70 - 99 mg/dL 161   096   88    ?BUN 6 - 20 mg/dL 18   17   14     ?Creatinine 0.61 - 1.24 mg/dL   0.45   4.09    ?Sodium 135 - 145 mmol/L 137   136   139    ?Potassium 3.5 - 5.1 mmol/L 4.1   3.9   4.5    ?Chloride 98 - 111 mmol/L 107   104   106    ?CO2 22 - 32 mmol/L 24   23   20     ?Calcium 8.9 - 10.3 mg/dL 9.4   9.6   9.3    ?Total Protein 6.5 - 8.1 g/dL 7.8    7.0    ?Total Bilirubin 0.3 - 1.2 mg/dL 0.2    0.9    ?Alkaline Phos 38 - 126 U/L 75    67    ?AST 15 - 41 U/L 14    29    ?ALT 0 - 44 U/L 18    26    ? ? ? ?RADIOGRAPHIC STUDIES: ?I have personally reviewed the radiological images as listed and agreed with the findings in the report. ?MR TIBIA FIBULA RIGHT W WO CONTRAST ? ?Result Date: 01/04/2022 ?CLINICAL DATA:  Right lower extremity pain. Chronic wound. Large exophytic mass seen on CT scan from 06/20/2020. EXAM: MRI OF LOWER RIGHT EXTREMITY WITHOUT AND WITH CONTRAST TECHNIQUE: Multiplanar, multisequence MR imaging of the right lower extremity was performed both before and after administration of intravenous contrast. CONTRAST:  52mL GADAVIST GADOBUTROL 1 MMOL/ML IV SOLN COMPARISON:  CT scan 06/20/2020 FINDINGS: There is a large exophytic enhancing soft tissue mass involving the inferior aspect of the right knee laterally. Lesion measures approximately 10.0 x 8.5 x 5.0 cm. On the prior CT scan from 2021 and measured 7.0 x 5.9 x 3.4  cm. The lesion involves the subcutaneous tissues and dermis and abuts but does not definitely involve the underlying muscle fascia. There is inflammation/edema and vascularity around the lesion but I do not see an

## 2022-01-09 DIAGNOSIS — R2241 Localized swelling, mass and lump, right lower limb: Secondary | ICD-10-CM | POA: Insufficient documentation

## 2022-01-10 ENCOUNTER — Ambulatory Visit (HOSPITAL_COMMUNITY): Payer: Medicaid Other

## 2022-01-14 ENCOUNTER — Other Ambulatory Visit: Payer: Self-pay | Admitting: Physician Assistant

## 2022-01-14 ENCOUNTER — Encounter (HOSPITAL_BASED_OUTPATIENT_CLINIC_OR_DEPARTMENT_OTHER): Payer: Medicaid Other | Attending: Internal Medicine | Admitting: Internal Medicine

## 2022-01-14 ENCOUNTER — Other Ambulatory Visit: Payer: Self-pay

## 2022-01-14 MED ORDER — OXYCODONE HCL ER 10 MG PO T12A
10.0000 mg | EXTENDED_RELEASE_TABLET | Freq: Two times a day (BID) | ORAL | 0 refills | Status: DC
  Filled 2022-01-14: qty 60, 30d supply, fill #0

## 2022-01-17 ENCOUNTER — Other Ambulatory Visit: Payer: Self-pay

## 2022-01-18 ENCOUNTER — Other Ambulatory Visit: Payer: Self-pay

## 2022-01-19 ENCOUNTER — Other Ambulatory Visit: Payer: Self-pay | Admitting: Physician Assistant

## 2022-01-19 ENCOUNTER — Ambulatory Visit (HOSPITAL_COMMUNITY): Payer: Self-pay

## 2022-01-19 ENCOUNTER — Other Ambulatory Visit: Payer: Self-pay

## 2022-01-19 MED ORDER — MORPHINE SULFATE ER 15 MG PO TBCR
15.0000 mg | EXTENDED_RELEASE_TABLET | Freq: Two times a day (BID) | ORAL | 0 refills | Status: AC
  Filled 2022-01-19: qty 60, 30d supply, fill #0

## 2022-01-19 MED ORDER — DOXYCYCLINE HYCLATE 100 MG PO TABS
100.0000 mg | ORAL_TABLET | Freq: Two times a day (BID) | ORAL | 0 refills | Status: DC
  Filled 2022-01-19: qty 20, 10d supply, fill #0

## 2022-01-20 ENCOUNTER — Other Ambulatory Visit: Payer: Self-pay

## 2022-01-24 ENCOUNTER — Other Ambulatory Visit: Payer: Self-pay | Admitting: Physician Assistant

## 2022-01-24 ENCOUNTER — Other Ambulatory Visit: Payer: Self-pay

## 2022-01-24 MED ORDER — OXYCODONE HCL 10 MG PO TABS
10.0000 mg | ORAL_TABLET | ORAL | 0 refills | Status: AC | PRN
  Filled 2022-01-24: qty 90, 15d supply, fill #0

## 2022-01-25 ENCOUNTER — Ambulatory Visit (HOSPITAL_COMMUNITY)
Admission: RE | Admit: 2022-01-25 | Discharge: 2022-01-25 | Disposition: A | Discharge status: Home / Self Care | Payer: Medicaid Other | Source: Ambulatory Visit | Attending: Physician Assistant | Admitting: Physician Assistant

## 2022-01-25 DIAGNOSIS — R2241 Localized swelling, mass and lump, right lower limb: Secondary | ICD-10-CM | POA: Insufficient documentation

## 2022-01-25 MED ORDER — SODIUM CHLORIDE (PF) 0.9 % IJ SOLN
INTRAMUSCULAR | Status: AC
  Filled 2022-01-25: qty 50

## 2022-01-25 MED ORDER — IOHEXOL 300 MG/ML  SOLN
100.0000 mL | Freq: Once | INTRAMUSCULAR | Status: AC | PRN
  Administered 2022-01-25: 100 mL via INTRAVENOUS

## 2022-01-27 ENCOUNTER — Telehealth: Payer: Self-pay | Admitting: Physician Assistant

## 2022-01-27 ENCOUNTER — Telehealth: Payer: Self-pay | Admitting: Hematology and Oncology

## 2022-01-27 DIAGNOSIS — R2241 Localized swelling, mass and lump, right lower limb: Secondary | ICD-10-CM

## 2022-01-27 NOTE — Telephone Encounter (Signed)
I spoke to [name] to review the CT scan results from 01/25/2022.  There is no definitive evidence of metastatic disease.  There is a subcentimeter liver lesion that is most likely benign.  Additionally there is an enlarged right inguinal lymph node measuring 1.3 cm.  Patient is scheduled to undergo suction of the soft tissue tumor on 01/31/2022 with Dr. Josefine Class.  We will see the patient back in 2 months to monitor the above findings with serial CT imaging.  He expressed understanding and satisfaction with the plan provided.

## 2022-01-27 NOTE — Telephone Encounter (Signed)
Scheduled per 5/18 secure chat, message has been left with pt

## 2022-02-08 ENCOUNTER — Other Ambulatory Visit: Payer: Self-pay | Admitting: Physician Assistant

## 2022-03-06 ENCOUNTER — Other Ambulatory Visit: Payer: Self-pay | Admitting: Physician Assistant

## 2022-03-08 ENCOUNTER — Other Ambulatory Visit: Payer: Self-pay

## 2022-03-25 ENCOUNTER — Ambulatory Visit (HOSPITAL_COMMUNITY): Payer: Medicaid Other

## 2022-03-29 ENCOUNTER — Other Ambulatory Visit: Payer: Medicaid Other

## 2022-03-29 ENCOUNTER — Ambulatory Visit: Payer: Medicaid Other | Admitting: Hematology and Oncology

## 2022-03-31 ENCOUNTER — Ambulatory Visit (HOSPITAL_COMMUNITY): Admission: RE | Admit: 2022-03-31 | Payer: Medicaid Other | Source: Ambulatory Visit

## 2022-03-31 ENCOUNTER — Inpatient Hospital Stay: Payer: Self-pay | Attending: Physician Assistant

## 2022-03-31 ENCOUNTER — Other Ambulatory Visit: Payer: Self-pay | Admitting: Hematology and Oncology

## 2022-03-31 DIAGNOSIS — R2241 Localized swelling, mass and lump, right lower limb: Secondary | ICD-10-CM

## 2022-04-03 NOTE — Progress Notes (Unsigned)
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 male with medical history significant for *** presents for a follow up visit. The patient's last visit was on ***. In the interim since the last visit ***  MEDICAL HISTORY:  Past Medical History:  Diagnosis Date   Fracture of distal end of tibia 11/22/2014   Fracture of right proximal fibula 11/23/2014   History of gonorrhea    treated august 2015   Polysubstance abuse [facility])     SURGICAL HISTORY: Past Surgical History:  Procedure Laterality Date   CRPP left wrist     As a kid   HARDWARE REMOVAL Right 04/28/2015   Procedure: HARDWARE REMOVAL RIGHT ANKLE, five screws 1 plate, and 1 washer removed;  Surgeon: Myrene Galas, MD;  Location: [facility] OR;  Service: Orthopedics;  Laterality: Right;   ORIF ANKLE FRACTURE Right 12/04/2014   Procedure: RIGHT OPEN REDUCTION INTERNAL FIXATION (ORIF) PILON FRACTURE;  Surgeon: Myrene Galas, MD;  Location: [facility] OR;  Service: Orthopedics;  Laterality: Right;  ANESTHESIA: GENERAL, REGIONAL BLOCK    SOCIAL HISTORY: Social History   Socioeconomic History   Marital status: Single    Spouse name: Not on file   Number of children: Not on file   Years of education: Not on file   Highest education level: Not on file  Occupational History   Not on file  Tobacco Use   Smoking status: Every Day    Packs/day: 0.50    Years: 15.00    Total pack years: 7.50    Types: Cigarettes   Smokeless tobacco: Never  Substance and Sexual Activity   Alcohol use: Not Currently    Alcohol/week: 20.0 standard drinks of alcohol    Types: 20 Cans of beer per week   Drug use: Yes    Types: Marijuana   Sexual activity: Yes  Other Topics Concern   Not on file  Social History Narrative    Employed by a temp agency   Social Determinants of Health   Financial Resource Strain: Not on file  Food Insecurity: Not on file  Transportation Needs: Not on file  Physical Activity: Not on file  Stress: Not on file  Social Connections: Not on file  Intimate Partner Violence: Not on file    FAMILY HISTORY: Family History  Problem Relation Age of Onset   Hypertension Mother    Diabetes type II Mother    Hypertension Father    Cancer Maternal Grandfather     ALLERGIES:  has No Known Allergies.  MEDICATIONS:  Current Outpatient Medications  Medication Sig Dispense Refill   amLODipine (NORVASC) 5 MG tablet Take 1 tablet (5 mg total) by mouth daily. 30 tablet 1   doxycycline (VIBRA-TABS) 100 MG tablet Take 1 tablet (100 mg total) by mouth 2 (two) times daily. 20 tablet 0   morphine (MS CONTIN) 15 MG 12 hr tablet Take 1 tablet (15 mg total) by mouth every 12 (twelve) hours. 60 tablet 0   Oxycodone HCl 10 MG TABS Take 1 tablet (10 mg total) by mouth every 4 (four) hours as needed. 90 tablet 0   No current facility-administered medications for this visit.    REVIEW OF SYSTEMS:   Constitutional: ( - ) fevers, ( - )  chills , ( - ) night sweats Eyes: ( - )  blurriness of vision, ( - ) double vision, ( - ) watery eyes Ears, nose, mouth, throat, and face: ( - ) mucositis, ( - ) sore throat Respiratory: ( - ) cough, ( - ) dyspnea, ( - ) wheezes Cardiovascular: ( - ) palpitation, ( - ) chest discomfort, ( - ) lower extremity swelling Gastrointestinal:  ( - ) nausea, ( - ) heartburn, ( - ) change in bowel habits Skin: ( - ) abnormal skin rashes Lymphatics: ( - ) new lymphadenopathy, ( - ) easy bruising Neurological: ( - ) numbness, ( - ) tingling, ( - ) new weaknesses Behavioral/Psych: ( - ) mood change, ( - ) new changes  All other systems were reviewed with the patient and are negative.  PHYSICAL EXAMINATION: ECOG PERFORMANCE STATUS: {CHL ONC ECOG PS:[phone]}  There were no  vitals filed for this visit. There were no vitals filed for this visit.  GENERAL: alert, no distress and comfortable SKIN: skin color, texture, turgor are normal, no rashes or significant lesions EYES: conjunctiva are pink and non-injected, sclera clear OROPHARYNX: no exudate, no erythema; lips, buccal mucosa, and tongue normal  NECK: supple, non-tender LYMPH:  no palpable lymphadenopathy in the cervical, axillary or inguinal LUNGS: clear to auscultation and percussion with normal breathing effort HEART: regular rate & rhythm and no murmurs and no lower extremity edema ABDOMEN: soft, non-tender, non-distended, normal bowel sounds Musculoskeletal: no cyanosis of digits and no clubbing  PSYCH: alert & oriented x 3, fluent speech NEURO: no focal motor/sensory deficits  LABORATORY DATA:  I have reviewed the data as listed    Latest Ref Rng & Units 01/07/2022    2:03 PM 01/03/2022   10:14 PM 06/20/2020    4:36 PM  CBC  WBC 4.0 - 10.5 K/uL 6.2  7.9  7.4   Hemoglobin 13.0 - 17.0 g/dL 44.0  34.7  42.5   Hematocrit 39.0 - 52.0 % 40.9  43.4  42.6   Platelets 150 - 400 K/uL 313  381  289        Latest Ref Rng & Units 01/07/2022    2:03 PM 01/03/2022   10:14 PM 06/20/2020    4:36 PM  CMP  Glucose 70 - 99 mg/dL 956  387  88   BUN 6 - 20 mg/dL 18  17  14    Creatinine 0.61 - 1.24 mg/dL  5.64  3.32   Sodium 135 - 145 mmol/L 137  136  139   Potassium 3.5 - 5.1 mmol/L 4.1  3.9  4.5   Chloride 98 - 111 mmol/L 107  104  106   CO2 22 - 32 mmol/L 24  23  20    Calcium 8.9 - 10.3 mg/dL 9.4  9.6  9.3   Total Protein 6.5 - 8.1 g/dL 7.8   7.0   Total Bilirubin 0.3 - 1.2 mg/dL 0.2   0.9   Alkaline Phos 38 - 126 U/L 75   67   AST 15 - 41 U/L 14   29   ALT 0 - 44 U/L 18   26     No results found for: "MPROTEIN" No results found for: "KPAFRELGTCHN", "LAMBDASER", "KAPLAMBRATIO"   BLOOD FILM: *** Review of the peripheral blood smear showed normal appearing white cells with neutrophils that were  appropriately lobated and granulated. There was no predominance of bi-lobed or hyper-segmented neutrophils appreciated. No Dohle bodies were noted. There was no left shifting, immature forms or blasts noted. Lymphocytes remain normal  in size without any predominance of large granular lymphocytes. Red cells show no anisopoikilocytosis, macrocytes , microcytes or polychromasia. There were no schistocytes, target cells, echinocytes, acanthocytes, dacrocytes, or stomatocytes.There was no rouleaux formation, nucleated red cells, or intra-cellular inclusions noted. The platelets are normal in size, shape, and color without any clumping evident.  RADIOGRAPHIC STUDIES: I have personally reviewed the radiological images as listed and agreed with the findings in the report. No results found.  ASSESSMENT & PLAN ***  No orders of the defined types were placed in this encounter.   All questions were answered. The patient knows to call the clinic with any problems, questions or concerns.  A total of more than 30 minutes were spent on this encounter with face-to-face time and non-face-to-face time, including preparing to see the patient, ordering tests and/or medications, counseling the patient and coordination of care as outlined above.   Ulysees Barns, MD Department of Hematology/Oncology [facility] Phone: [phone] Pager: [phone] Email: [email or web address]  04/03/2022 2:40 PM

## 2022-04-04 ENCOUNTER — Telehealth: Payer: Self-pay | Admitting: Hematology and Oncology

## 2022-04-04 ENCOUNTER — Inpatient Hospital Stay: Payer: Self-pay

## 2022-04-04 ENCOUNTER — Inpatient Hospital Stay: Payer: Self-pay | Admitting: Hematology and Oncology

## 2022-04-04 NOTE — Telephone Encounter (Signed)
Per 7/24 msg called pt and left a message to come in at 9:30 for lab work before his appointment  details and callback number were left

## 2022-05-28 ENCOUNTER — Other Ambulatory Visit (HOSPITAL_COMMUNITY): Payer: Self-pay

## 2022-05-28 ENCOUNTER — Telehealth: Payer: Self-pay

## 2022-05-28 ENCOUNTER — Emergency Department (HOSPITAL_COMMUNITY): Payer: Self-pay

## 2022-05-28 ENCOUNTER — Encounter (HOSPITAL_COMMUNITY): Payer: Self-pay | Admitting: Emergency Medicine

## 2022-05-28 ENCOUNTER — Emergency Department (HOSPITAL_COMMUNITY)
Admission: EM | Admit: 2022-05-28 | Discharge: 2022-05-28 | Disposition: A | Discharge status: Home / Self Care | Payer: Self-pay | Attending: Emergency Medicine | Admitting: Emergency Medicine

## 2022-05-28 ENCOUNTER — Other Ambulatory Visit: Payer: Self-pay

## 2022-05-28 DIAGNOSIS — E119 Type 2 diabetes mellitus without complications: Secondary | ICD-10-CM | POA: Insufficient documentation

## 2022-05-28 DIAGNOSIS — I10 Essential (primary) hypertension: Secondary | ICD-10-CM

## 2022-05-28 DIAGNOSIS — K047 Periapical abscess without sinus: Secondary | ICD-10-CM | POA: Insufficient documentation

## 2022-05-28 DIAGNOSIS — D72829 Elevated white blood cell count, unspecified: Secondary | ICD-10-CM | POA: Insufficient documentation

## 2022-05-28 LAB — BASIC METABOLIC PANEL
Anion gap: 11 (ref 5–15)
BUN: 9 mg/dL (ref 6–20)
CO2: 22 mmol/L (ref 22–32)
Calcium: 9 mg/dL (ref 8.9–10.3)
Chloride: 105 mmol/L (ref 98–111)
Creatinine, Ser: 1.02 mg/dL (ref 0.61–1.24)
GFR, Estimated: >60 mL/min (ref >60)
Glucose, Bld: 111 mg/dL — ABNORMAL HIGH (ref 70–99)
Potassium: 3.6 mmol/L (ref 3.5–5.1)
Sodium: 138 mmol/L (ref 135–145)

## 2022-05-28 LAB — CBC WITH DIFFERENTIAL/PLATELET
Abs Immature Granulocytes: 0.07 10*3/uL (ref 0.00–0.07)
Basophils Absolute: 0.1 10*3/uL (ref 0.0–0.1)
Basophils Relative: 0 %
Eosinophils Absolute: 0.3 10*3/uL (ref 0.0–0.5)
Eosinophils Relative: 2 %
HCT: 44.4 % (ref 39.0–52.0)
Hemoglobin: 14.8 g/dL (ref 13.0–17.0)
Immature Granulocytes: 1 %
Lymphocytes Relative: 18 %
Lymphs Abs: 2.4 10*3/uL (ref 0.7–4.0)
MCH: 28.5 pg (ref 26.0–34.0)
MCHC: 33.3 g/dL (ref 30.0–36.0)
MCV: 85.4 fL (ref 80.0–100.0)
Monocytes Absolute: 1.4 10*3/uL — ABNORMAL HIGH (ref 0.1–1.0)
Monocytes Relative: 11 %
Neutro Abs: 9.4 10*3/uL — ABNORMAL HIGH (ref 1.7–7.7)
Neutrophils Relative %: 68 %
Platelets: 292 10*3/uL (ref 150–400)
RBC: 5.2 MIL/uL (ref 4.22–5.81)
RDW: 14.1 % (ref 11.5–15.5)
WBC: 13.6 10*3/uL — ABNORMAL HIGH (ref 4.0–10.5)
nRBC: 0 % (ref 0.0–0.2)

## 2022-05-28 MED ORDER — LIDOCAINE HCL (PF) 1 % IJ SOLN
5.0000 mL | Freq: Once | INTRAMUSCULAR | Status: AC
  Administered 2022-05-28: 5 mL
  Filled 2022-05-28: qty 5

## 2022-05-28 MED ORDER — SODIUM CHLORIDE 0.9 % IV SOLN
3.0000 g | Freq: Once | INTRAVENOUS | Status: AC
  Administered 2022-05-28: 3 g via INTRAVENOUS
  Filled 2022-05-28: qty 8

## 2022-05-28 MED ORDER — CHLORHEXIDINE GLUCONATE 0.12 % MT SOLN
15.0000 mL | Freq: Two times a day (BID) | OROMUCOSAL | 0 refills | Status: AC
  Filled 2022-05-28: qty 473, 16d supply, fill #0

## 2022-05-28 MED ORDER — MORPHINE SULFATE (PF) 4 MG/ML IV SOLN
4.0000 mg | Freq: Once | INTRAVENOUS | Status: AC
  Administered 2022-05-28: 4 mg via INTRAVENOUS
  Filled 2022-05-28: qty 1

## 2022-05-28 MED ORDER — IOHEXOL 350 MG/ML SOLN
75.0000 mL | Freq: Once | INTRAVENOUS | Status: AC | PRN
  Administered 2022-05-28: 75 mL via INTRAVENOUS

## 2022-05-28 MED ORDER — AMOXICILLIN-POT CLAVULANATE 875-125 MG PO TABS
1.0000 | ORAL_TABLET | Freq: Two times a day (BID) | ORAL | 0 refills | Status: DC
  Filled 2022-05-28: qty 14, 7d supply, fill #0

## 2022-05-28 MED ORDER — AMLODIPINE BESYLATE 5 MG PO TABS
5.0000 mg | ORAL_TABLET | Freq: Every day | ORAL | 0 refills | Status: AC
  Filled 2022-05-28: qty 30, 30d supply, fill #0

## 2022-05-28 NOTE — ED Provider Notes (Signed)
[facility] EMERGENCY DEPARTMENT Provider Note   CSN: [id number] Arrival date & time: 05/28/22  0526     History  Chief Complaint  Patient presents with   Dental Pain    [name] Hart is a 36 y.o. male.   Dental Pain  Patient is a 36 year old male who denies any history of diabetes or other medical problems does not take any medications and presents emergency room today with now 3 days of left-sided dental pain and progressive swelling of the left side of face.  Denies any fevers nausea vomiting chest pain or difficulty breathing.    Home Medications Prior to Admission medications   Medication Sig Start Date End Date Taking? Authorizing Provider  amoxicillin-clavulanate (AUGMENTIN) 875-125 MG tablet Take 1 tablet by mouth every 12 (twelve) hours. 05/28/22  Yes Aunya Lemler S, PA  chlorhexidine (PERIDEX) 0.12 % solution Use as directed 15 mLs in the mouth or throat 2 (two) times daily. 05/28/22  Yes Soma Bachand S, PA  amLODipine (NORVASC) 5 MG tablet Take 1 tablet (5 mg total) by mouth daily. 05/28/22 06/27/22  Tedd Sias, PA  morphine (MS CONTIN) 15 MG 12 hr tablet Take 1 tablet (15 mg total) by mouth every 12 (twelve) hours. 01/19/22   Lincoln Brigham, PA-C  Oxycodone HCl 10 MG TABS Take 1 tablet (10 mg total) by mouth every 4 (four) hours as needed. 01/24/22   Lincoln Brigham, PA-C      Allergies    Patient has no known allergies.    Review of Systems   Review of Systems  Physical Exam Updated Vital Signs BP (!) 189/100   Pulse 72   Temp 98 F (36.7 C) (Oral)   Resp 18   Ht 5\' 7"  (1.702 m)   Wt 115.7 kg   SpO2 100%   BMI 39.94 kg/m  Physical Exam Vitals and nursing note reviewed.  Constitutional:      General: He is not in acute distress.    Appearance: Normal appearance. He is not ill-appearing.  HENT:     Head: Normocephalic and atraumatic.     Mouth/Throat:     Comments: Left-sided facial swelling, 2 finger trismus, managing  secretions Uvula midline Poor dentition throughout Questionable left lower dentition with periapical abscess versus cellulitis. Eyes:     General: No scleral icterus.       Right eye: No discharge.        Left eye: No discharge.     Conjunctiva/sclera: Conjunctivae normal.  Pulmonary:     Effort: Pulmonary effort is normal.     Breath sounds: No stridor.  Neurological:     Mental Status: He is alert and oriented to person, place, and time. Mental status is at baseline.     ED Results / Procedures / Treatments   Labs (all labs ordered are listed, but only abnormal results are displayed) Labs Reviewed  CBC WITH DIFFERENTIAL/PLATELET - Abnormal; Notable for the following components:      Result Value   WBC 13.6 (*)    Neutro Abs 9.4 (*)    Monocytes Absolute 1.4 (*)    All other components within normal limits  BASIC METABOLIC PANEL - Abnormal; Notable for the following components:   Glucose, Bld 111 (*)    All other components within normal limits    EKG None  Radiology CT Soft Tissue Neck W Contrast  Result Date: 05/28/2022 CLINICAL DATA:  Soft tissue swelling, infection suspected. Left neck swelling.  EXAM: CT NECK WITH CONTRAST TECHNIQUE: Multidetector CT imaging of the neck was performed using the standard protocol following the bolus administration of intravenous contrast. RADIATION DOSE REDUCTION: This exam was performed according to the departmental dose-optimization program which includes automated exposure control, adjustment of the mA and/or kV according to patient size and/or use of iterative reconstruction technique. CONTRAST:  75mL OMNIPAQUE IOHEXOL 350 MG/ML SOLN COMPARISON:  None Available. FINDINGS: Pharynx and larynx: Normal. No mass or swelling. Salivary glands: No inflammation, mass, or stone. Thyroid: Negative Lymph nodes: Right level 2 lymph node 15 mm, upper normal. Left level 2 lymph nodes 11 mm and 12 mm. No nodal necrosis. Periparotid lymph nodes are not  pathologically enlarged bilaterally. Vascular: Normal vascular enhancement Limited intracranial: Negative Visualized orbits: Negative Mastoids and visualized paranasal sinuses: Mild mucosal edema paranasal sinuses. Skeleton: Poor dentition with multiple caries. Periapical lucency around right lower second and third molars. Periapical lucency around right upper first and second molars. Periapical lucency around left lower second molar Upper chest: Lung apices clear bilaterally Other: Moderate soft tissue swelling lateral to the mandible on the left involving the deep tissues and subcutaneous tissues. This is presumably cellulitis from dental infection in the left lower molar. 4 x 20 mm subperiosteal abscess lateral to the left lower second molar which has periapical abscess. IMPRESSION: Moderate soft tissue swelling lateral to the mandible on the left consistent with infection and cellulitis. 4 x 20 mm subperiosteal abscess lateral to the mandible this level compatible with dental infection. Periapical abscess and caries left lower second molar. Electronically Signed   By: Franchot Gallo M.D.   On: 05/28/2022 12:00    Procedures Procedures    Medications Ordered in ED Medications  morphine (PF) 4 MG/ML injection 4 mg (4 mg Intravenous Given 05/28/22 1110)  iohexol (OMNIPAQUE) 350 MG/ML injection 75 mL (75 mLs Intravenous Contrast Given 05/28/22 1146)  Ampicillin-Sulbactam (UNASYN) 3 g in sodium chloride 0.9 % 100 mL IVPB (0 g Intravenous Stopped 05/28/22 1403)  lidocaine (PF) (XYLOCAINE) 1 % injection 5 mL (5 mLs Infiltration Given 05/28/22 1244)    ED Course/ Medical Decision Making/ A&P Clinical Course as of 05/28/22 1450  Sat May 28, 2022  1215 IMPRESSION: Moderate soft tissue swelling lateral to the mandible on the left consistent with infection and cellulitis. 4 x 20 mm subperiosteal abscess lateral to the mandible this level compatible with dental infection. Periapical abscess and caries left  lower second molar. [WF]    Clinical Course User Index [WF] Tedd Sias, PA                           Medical Decision Making Risk Prescription drug management.   Patient is a 36 year old male who denies any history of diabetes or other medical problems does not take any medications and presents emergency room today with now 3 days of left-sided dental pain and progressive swelling of the left side of face.  Denies any fevers nausea vomiting chest pain or difficulty breathing.  Exam with possible periapical abscess.  CBC with leukocytosis BMP unremarkable.  I personally viewed images of CT soft tissue neck which show cellulitic changes as well as a subperiosteal abscess. I discussed this case with my attending physician who cosigned this note including patient's presenting symptoms, physical exam, and planned diagnostics and interventions. Attending physician stated agreement with plan or made changes to plan which were implemented.  Attending physician assessed patient at bedside.  We will provide patient with 3 g of Unasyn and discharged home with Augmentin.  Peridex and refill his amlodipine.  Pain much improved after morphine here.  Patient understands the importance of following up with dentistry on Monday return precautions discussed.  Final Clinical Impression(s) / ED Diagnoses Final diagnoses:  Dental abscess    Rx / DC Orders ED Discharge Orders          Ordered    amoxicillin-clavulanate (AUGMENTIN) 875-125 MG tablet  Every 12 hours        05/28/22 1345    chlorhexidine (PERIDEX) 0.12 % solution  2 times daily        05/28/22 1345    amLODipine (NORVASC) 5 MG tablet  Daily        05/28/22 1345              Tedd Sias, [place] 05/28/22 1451    Lajean Saver, MD 05/28/22 1530

## 2022-05-28 NOTE — Discharge Instructions (Addendum)
You have a dental abscess and will need to follow-up with Dr. Walden Field who is one of her dentist.  Please call Monday morning to make an appointment and let them know that you were found to have a dental abscess.  Take antibiotics for the entire course even if your symptoms improve.  Please use Tylenol or ibuprofen for pain.  You may use 600 mg ibuprofen every 6 hours or 1000 mg of Tylenol every 6 hours.  You may choose to alternate between the 2.  This would be most effective.  Not to exceed 4 g of Tylenol within 24 hours.  Not to exceed 3200 mg ibuprofen 24 hours.   Use the Peridex rinse that I have written a prescription for  I have also refilled your amlodipine which is a blood pressure medicine.

## 2022-05-28 NOTE — ED Provider Notes (Signed)
Ultrasound ED Peripheral IV (Provider)  Date/Time: 05/28/2022 11:07 AM  Performed by: Tedd Sias, PA Authorized by: Tedd Sias, PA   Procedure details:    Skin Prep: chlorhexidine gluconate     Location:  Left AC   Angiocath:  18 G   Bedside Ultrasound Guided: Yes     Images: not archived     Patient tolerated procedure without complications: Yes     Dressing applied: Yes       Tedd Sias, PA 05/28/22 1107    Lajean Saver, MD 05/28/22 1130

## 2022-05-28 NOTE — ED Triage Notes (Signed)
Pt c/o lower left dental pain with swelling to face, ear and neck pain x 2 days

## 2022-05-28 NOTE — ED Provider Triage Note (Signed)
Emergency Medicine Provider Triage Evaluation Note  [name] Hart , a 36 y.o. male  was evaluated in triage.  Pt complains of left lower dental pain x2 days with significant left-sided facial swelling and difficulty swallowing.  Pain down into the left neck as well, no fevers or chills, impeding p.o. intake.  Minimal improvement with Tylenol.  Review of Systems  Positive: Facial swelling, dental infection Negative: Nausea vomiting diarrhea  Physical Exam  BP (!) 140/107   Pulse (!) 104   Temp 99.9 F (37.7 C) (Oral)   Resp 20   SpO2 95%  Gen:   Awake, visibly uncomfortable, tearful Resp:  Normal effort  MSK:   Moves extremities without difficulty  Other:  Notable left-sided facial swelling extending down the left anterolateral neck.  Tenderness palpation to the sub-mandibular area.  Left mandibular molar decay, extensive with extensive buccal mucosal and gingival edema on that side and trismus.  Medical Decision Making  Medically screening exam initiated at 5:49 AM.  Appropriate orders placed.  [facility] was informed that the remainder of the evaluation will be completed by another provider, this initial triage assessment does not replace that evaluation, and the importance of remaining in the ED until their evaluation is complete.  Concern for possible extension of the dental infection into the deep spaces of the neck given anterolateral left neck swelling, tenderness to palpation, and significant trismus.  Labs and CT imaging ordered.  This chart was dictated using voice recognition software, Dragon. Despite the best efforts of this provider to proofread and correct errors, errors may still occur which can change documentation meaning.    Emeline Darling, PA-C 05/28/22 [phone]

## 2022-05-28 NOTE — Telephone Encounter (Signed)
Patient left ED at 1100 today, scripts called in to [place] , but  they are closed until Monday. Per request called medications as written in to [facility] on [address].Will cancel on Monday with community health.

## 2022-05-30 ENCOUNTER — Other Ambulatory Visit: Payer: Self-pay

## 2022-06-03 ENCOUNTER — Other Ambulatory Visit: Payer: Self-pay

## 2022-09-27 DIAGNOSIS — K0889 Other specified disorders of teeth and supporting structures: Secondary | ICD-10-CM | POA: Insufficient documentation

## 2022-09-28 ENCOUNTER — Other Ambulatory Visit: Payer: Self-pay

## 2022-09-28 ENCOUNTER — Emergency Department (HOSPITAL_COMMUNITY)
Admission: EM | Admit: 2022-09-28 | Discharge: 2022-09-28 | Disposition: A | Discharge status: Home / Self Care | Payer: Self-pay | Attending: Student | Admitting: Student

## 2022-09-28 ENCOUNTER — Encounter (HOSPITAL_COMMUNITY): Payer: Self-pay | Admitting: Emergency Medicine

## 2022-09-28 DIAGNOSIS — K0889 Other specified disorders of teeth and supporting structures: Secondary | ICD-10-CM

## 2022-09-28 MED ORDER — PENICILLIN V POTASSIUM 500 MG PO TABS: 500.0000 mg | ORAL_TABLET | Freq: Four times a day (QID) | ORAL | 0 refills | Status: AC

## 2022-09-28 MED ORDER — OXYCODONE-ACETAMINOPHEN 5-325 MG PO TABS
1.0000 | ORAL_TABLET | Freq: Once | ORAL | Status: AC
  Administered 2022-09-28: 1 via ORAL
  Filled 2022-09-28: qty 1

## 2022-09-28 NOTE — ED Provider Notes (Signed)
[facility] EMERGENCY DEPARTMENT Provider Note   CSN: [id number] Arrival date & time: 09/27/22  2333     History  Chief Complaint  Patient presents with   Dental Pain    [name] Hart is a 37 y.o. male.  HPI   Patient without significant medical history is presenting with complaints of dental pain, been going on for about a few weeks, pain is in his left lower back molar, pain is gotten worse over the last couple days, he notes some left-sided facial swelling, denies any tongue throat lip swelling difficulty breathing, no chest pain no shortness of breath, no difficulty swallowing, he had no fever or chills.  States that he has been seen in the past for similar presentation was given antibiotics, states he is going  to see a dentist in 1 month's time.  Home Medications Prior to Admission medications   Medication Sig Start Date End Date Taking? Authorizing Provider  penicillin v potassium (VEETID) 500 MG tablet Take 1 tablet (500 mg total) by mouth 4 (four) times daily for 7 days. 09/28/22 10/05/22 Yes Carroll Sage, PA-C  amLODipine (NORVASC) 5 MG tablet Take 1 tablet (5 mg total) by mouth daily. 05/28/22 07/03/22  Gailen Shelter, PA  amoxicillin-clavulanate (AUGMENTIN) 875-125 MG tablet Take 1 tablet by mouth every 12 (twelve) hours. 05/28/22   Gailen Shelter, PA  chlorhexidine (PERIDEX) 0.12 % solution Use as directed 15 mLs in the mouth or throat 2 (two) times daily. 05/28/22   Gailen Shelter, PA  morphine (MS CONTIN) 15 MG 12 hr tablet Take 1 tablet (15 mg total) by mouth every 12 (twelve) hours. 01/19/22   Briant Cedar, PA-C  Oxycodone HCl 10 MG TABS Take 1 tablet (10 mg total) by mouth every 4 (four) hours as needed. 01/24/22   Briant Cedar, PA-C      Allergies    Patient has no known allergies.    Review of Systems   Review of Systems  Constitutional:  Negative for chills and fever.  HENT:  Positive for dental problem.   Respiratory:   Negative for shortness of breath.   Cardiovascular:  Negative for chest pain.  Gastrointestinal:  Negative for abdominal pain.  Neurological:  Negative for headaches.    Physical Exam Updated Vital Signs BP (!) 160/99 (BP Location: Left Arm)   Pulse 84   Temp 99.1 F (37.3 C)   Resp 16   Ht 5\' 6"  (1.676 m)   Wt 113.4 kg   SpO2 98%   BMI 40.35 kg/m  Physical Exam Vitals and nursing note reviewed.  Constitutional:      General: He is not in acute distress.    Appearance: He is not ill-appearing.  HENT:     Head: Normocephalic and atraumatic.     Nose: No congestion.     Mouth/Throat:     Mouth: Mucous membranes are moist.     Pharynx: Oropharynx is clear. No oropharyngeal exudate or posterior oropharyngeal erythema.     Comments: Very minimal edema around the left mandible, there is no overlying skin changes, no trismus no torticollis no oral edema present, tongue uvula both midline controlling oral  secretions tonsils both equal symmetric bilaterally.  There is no submandibular swelling.  Patient poor dental hygiene, there is notable cavity in the left bottom molar no evidence of gingivitis no drainable abscess present during my examination. Eyes:     Conjunctiva/sclera: Conjunctivae normal.  Cardiovascular:  Rate and Rhythm: Normal rate and regular rhythm.     Pulses: Normal pulses.  Pulmonary:     Effort: Pulmonary effort is normal.  Skin:    General: Skin is warm and dry.  Neurological:     Mental Status: He is alert.  Psychiatric:        Mood and Affect: Mood normal.     ED Results / Procedures / Treatments   Labs (all labs ordered are listed, but only abnormal results are displayed) Labs Reviewed - No data to display  EKG None  Radiology No results found.  Procedures Procedures    Medications Ordered in ED Medications  oxyCODONE-acetaminophen (PERCOCET/ROXICET) 5-325 MG per tablet 1 tablet (1 tablet Oral Given 09/28/22 0159)    ED Course/  Medical Decision Making/ A&P                             Medical Decision Making Risk Prescription drug management.   This patient presents to the ED for concern of dental pain, this involves an extensive number of treatment options, and is a complaint that carries with it a high risk of complications and morbidity.  The differential diagnosis includes Ludwig angina, dental abscess, peritonsillar abscess    Additional history obtained:  Additional history obtained from N/A External records from outside source obtained and reviewed including ER notes   Co morbidities that complicate the patient evaluation  N/A  Social Determinants of Health:  N/A    Lab Tests:  I Ordered, and personally interpreted labs.  The pertinent results include: N/A   Imaging Studies ordered:  I ordered imaging studies including N/A I independently visualized and interpreted imaging which showed N/A I agree with the radiologist interpretation   Cardiac Monitoring:  The patient was maintained on a cardiac monitor.  I personally viewed and interpreted the cardiac monitored which showed an underlying rhythm of: N/A   Medicines ordered and prescription drug management:  I ordered medication including oxycodone I have reviewed the patients home medicines and have made adjustments as needed  Critical Interventions:  N/a   Reevaluation:  Presents with dental pain benign physical exam agreement with discharge at this time.  Consultations Obtained:  N/a    Test Considered:  N/a    Rule out I have low suspicion for peritonsillar abscess, retropharyngeal abscess, or Ludwig angina as oropharynx was visualized tongue and uvula were both midline, there is no exudates, erythema or edema noted in the posterior pillars or on/ around tonsils.  Low suspicion for an drainable abscess as gumline were palpated no fluctuance or induration felt.  Low suspicion for periorbital or orbital  cellulitis as patient face had no erythematous, patient EOMs were intact, he had no pain with eye movement.     Dispostion and problem list  After consideration of the diagnostic results and the patients response to treatment, I feel that the patent would benefit from discharge.  Dental pain-likely cavity, started on antibiotics referred to a dentist for further evaluation and strict return precautions.            Final Clinical Impression(s) / ED Diagnoses Final diagnoses:  Pain, dental    Rx / DC Orders ED Discharge Orders          Ordered    penicillin v potassium (VEETID) 500 MG tablet  4 times daily        09/28/22 0157  Marcello Fennel, PA-C 09/28/22 0402    Ripley Fraise, MD 09/28/22 [phone]

## 2022-09-28 NOTE — Discharge Instructions (Addendum)
Your dental pain is likely from a cavity, have started on antibiotics please take as prescribed.  You may use over-the-counter pain medication like ibuprofen Tylenol.  Please follow-up with your dentist for further evaluation  If you note after 5 days of antibiotic use you having worsening pain swelling, unable to swallow your own saliva, difficulty breathing, swelling beneath your tongue you must come back in for reassessment.

## 2022-09-28 NOTE — ED Triage Notes (Signed)
Pt reports left lower dental pain.  "I need to get my tooth pulled." Pain started 2 days ago, "I need some antibiotics so that I can get it pulled."

## 2023-04-19 ENCOUNTER — Encounter (HOSPITAL_COMMUNITY): Payer: Self-pay

## 2023-04-19 ENCOUNTER — Other Ambulatory Visit: Payer: Self-pay

## 2023-04-19 ENCOUNTER — Emergency Department (HOSPITAL_COMMUNITY)
Admission: EM | Admit: 2023-04-19 | Discharge: 2023-04-19 | Disposition: A | Discharge status: Home / Self Care | Payer: 59 | Attending: Emergency Medicine | Admitting: Emergency Medicine

## 2023-04-19 DIAGNOSIS — B9689 Other specified bacterial agents as the cause of diseases classified elsewhere: Secondary | ICD-10-CM | POA: Diagnosis not present

## 2023-04-19 DIAGNOSIS — H5789 Other specified disorders of eye and adnexa: Secondary | ICD-10-CM | POA: Diagnosis not present

## 2023-04-19 DIAGNOSIS — H1031 Unspecified acute conjunctivitis, right eye: Secondary | ICD-10-CM | POA: Diagnosis not present

## 2023-04-19 DIAGNOSIS — H109 Unspecified conjunctivitis: Secondary | ICD-10-CM

## 2023-04-19 MED ORDER — CIPROFLOXACIN HCL 0.3 % OP SOLN
2.0000 [drp] | Freq: Once | OPHTHALMIC | Status: DC
  Filled 2023-04-19: qty 2.5

## 2023-04-19 MED ORDER — CIPROFLOXACIN HCL 0.3 % OP SOLN: 2.0000 [drp] | OPHTHALMIC | 0 refills | Status: AC

## 2023-04-19 NOTE — ED Notes (Signed)
Awaiting Ciprofloxacin ophthalmic solution from main pharmacy to administer to patient

## 2023-04-19 NOTE — Discharge Instructions (Signed)
You were seen in the ER for eye concerns. On examination, it appears that the right eye is irritated but improving with time. Given your history, this is likely to be due to a bacterial infection in the eye. I have prescribed antibiotic drops to use over the next week. Please follow up with an ophthalmologist to ensure your eyes are recovering well without complications. If symptoms are worsening, please return to the ER.

## 2023-04-19 NOTE — ED Provider Notes (Signed)
[place] EMERGENCY DEPARTMENT AT [facility] Provider Note   CSN: [id number] Arrival date & time: 04/19/23  1013     History Chief Complaint  Patient presents with   Eye Problem    [name] Hart is a 37 y.o. male. Patient presents to the ED with concerns of eye irritation. States this has been ongoing for several days after he woke up one morning with his eye red, crusted, and irritated. No known exposure to others with similar symptoms. Denies any foreign body coming into contact with eye and does not feel that something is stuck in his eye. Denies seasonal allergies. Has not tried taking anything for this. Feels that the eye is improving slightly with time, but still irritated.   Eye Problem Associated symptoms: discharge and redness   Associated symptoms: no photophobia        Home Medications Prior to Admission medications   Medication Sig Start Date End Date Taking? Authorizing Provider  ciprofloxacin (CILOXAN) 0.3 % ophthalmic solution Place 2 drops into the right eye every 2 (two) hours. Administer 1 drop, every 2 hours, while awake, for 2 days. Then 1 drop, every 4 hours, while awake, for the next 5 days. 04/19/23  Yes Maryanna Shape A, PA-C  amLODipine (NORVASC) 5 MG tablet Take 1 tablet (5 mg total) by mouth daily. 05/28/22 07/03/22  Gailen Shelter, PA  amoxicillin-clavulanate (AUGMENTIN) 875-125 MG tablet Take 1 tablet by mouth every 12 (twelve) hours. 05/28/22   Gailen Shelter, PA  chlorhexidine (PERIDEX) 0.12 % solution Use as directed 15 mLs in the mouth or throat 2 (two) times daily. 05/28/22   Gailen Shelter, PA  morphine (MS CONTIN) 15 MG 12 hr tablet Take 1 tablet (15 mg total) by mouth every 12 (twelve) hours. 01/19/22   Briant Cedar, PA-C  Oxycodone HCl 10 MG TABS Take 1 tablet (10 mg total) by mouth every 4 (four) hours as needed. 01/24/22   Briant Cedar, PA-C      Allergies    Patient has no known allergies.    Review of Systems   Review of  Systems  Eyes:  Positive for discharge and redness. Negative for photophobia.  All other systems reviewed and are negative.   Physical Exam Updated Vital Signs BP 131/89 (BP Location: Right Arm)   Pulse 84   Temp 98.2 F (36.8 C) (Oral)   Resp 18   SpO2 95%  Physical Exam Vitals and nursing note reviewed.  Constitutional:      General: He is not in acute distress.    Appearance: He is well-developed.  HENT:     Head: Normocephalic and atraumatic.  Eyes:     General:        Right eye: Discharge present.        Left eye: No discharge.     Extraocular Movements: Extraocular movements intact.     Conjunctiva/sclera: Conjunctivae normal.     Pupils: Pupils are equal, round, and reactive to light.     Comments: Corneal injection, primarily along medial aspect of eye. No obvious foreign body or ocular trauma. Mild drainage, but clear and watery, no mucopurulent discharge at this time.  Cardiovascular:     Rate and Rhythm: Normal rate and regular rhythm.     Heart sounds: No murmur heard. Pulmonary:     Effort: Pulmonary effort is normal. No respiratory distress.     Breath sounds: Normal breath sounds.  Abdominal:     Palpations:  Abdomen is soft.     Tenderness: There is no abdominal tenderness.  Musculoskeletal:        General: No swelling.     Cervical back: Neck supple.  Skin:    General: Skin is warm and dry.     Capillary Refill: Capillary refill takes less than 2 seconds.  Neurological:     Mental Status: He is alert.  Psychiatric:        Mood and Affect: Mood normal.     ED Results / Procedures / Treatments   Labs (all labs ordered are listed, but only abnormal results are displayed) Labs Reviewed - No data to display  EKG None  Radiology No results found.  Procedures Procedures   Medications Ordered in ED Medications  ciprofloxacin (CILOXAN) 0.3 % ophthalmic solution 2 drop (has no administration in time range)    ED Course/ Medical Decision  Making/ A&P                               Medical Decision Making  This patient presents to the ED for concern of eye problem.  Differential diagnosis includes bacterial conjunctivitis, viral conjunctivitis, seasonal allergies, foreign body   Medicines ordered and prescription drug management:  I ordered medication including ciprofloxacin for conjunctivitis  Reevaluation of the patient after these medicines showed that the patient improved I have reviewed the patients home medicines and have made adjustments as needed   Problem List / ED Course:  Patient presented to the ED with concerns of eye irritation. States that this began a few days ago and has slowly been improving. No vision changes, but does report waking up some mornings with crusting over of the eyelids. Has not started any medication for this. Denies anything getting into his eye or feeling that something is stuck on his eye. On assessment, no appreciable trauma to the eye is noted, but cornea is injected with watery drainage present. EOMs intact and PERRL. Low concern for corneal abrasion at this time per history and lack of diminished vision. No obvious retinal abnormalities on visualization. Will treat with ciprofloxacin drops and advise close follow up with ophthalmology for further evaluation and resolution of symptoms. Given current clear discharge, suspect this could be viral in nature, but onset with mucopurulent drainage concerns me for bacterial conjunctivitis requiring antibiotic therapy to clear. Patient is agreeable with treatment plan and verbalized understanding all return precautions. Considered need for admission but patient case appears to be minor and acute in nature easily managed in the outpatient setting.   Final Clinical Impression(s) / ED Diagnoses Final diagnoses:  Bacterial conjunctivitis of right eye    Rx / DC Orders ED Discharge Orders          Ordered    ciprofloxacin (CILOXAN) 0.3 %  ophthalmic solution  Every 2 hours        04/19/23 1104              Smitty Knudsen, PA-C 04/19/23 1108    Cathren Laine, MD 04/20/23 [phone]

## 2023-04-19 NOTE — ED Triage Notes (Signed)
Pt arrives via POV. Pt reports redness, drainage, and some blurred vision to left eye for about 1 week. Denies any injury.

## 2023-07-14 ENCOUNTER — Encounter (HOSPITAL_COMMUNITY): Payer: Self-pay

## 2023-07-14 ENCOUNTER — Emergency Department (HOSPITAL_COMMUNITY)
Admission: EM | Admit: 2023-07-14 | Discharge: 2023-07-14 | Disposition: A | Discharge status: Home / Self Care | Payer: 59 | Attending: Emergency Medicine | Admitting: Emergency Medicine

## 2023-07-14 DIAGNOSIS — K029 Dental caries, unspecified: Secondary | ICD-10-CM | POA: Diagnosis not present

## 2023-07-14 DIAGNOSIS — K0889 Other specified disorders of teeth and supporting structures: Secondary | ICD-10-CM

## 2023-07-14 MED ORDER — PENICILLIN V POTASSIUM 500 MG PO TABS: 500.0000 mg | ORAL_TABLET | Freq: Four times a day (QID) | ORAL | 0 refills | Status: AC

## 2023-07-14 NOTE — ED Provider Notes (Signed)
[place] EMERGENCY DEPARTMENT AT [facility] Provider Note   CSN: [id number] Arrival date & time: 07/14/23  1012     History  Chief Complaint  Patient presents with   Dental Pain    [name] Hart is a 37 y.o. male.  Patient with no pertinent past medical history presents today with complaints of dental pain.  He states that same began 3 days ago and has been persistent since then.  Pain is in his left lower back molar.  Notes some associated left-sided facial swelling without difficulty swallowing.  No shortness of breath.  No fevers or chills.  Notes that he has been here previously and has been given antibiotics with improvement.  Presents for same.  Has plans to see a dentist but has not scheduled appointment yet.  The history is provided by the patient. No language interpreter was used.  Dental Pain      Home Medications Prior to Admission medications   Medication Sig Start Date End Date Taking? Authorizing Provider  amLODipine (NORVASC) 5 MG tablet Take 1 tablet (5 mg total) by mouth daily. 05/28/22 07/03/22  Gailen Shelter, PA  amoxicillin-clavulanate (AUGMENTIN) 875-125 MG tablet Take 1 tablet by mouth every 12 (twelve) hours. 05/28/22   Gailen Shelter, PA  chlorhexidine (PERIDEX) 0.12 % solution Use as directed 15 mLs in the mouth or throat 2 (two) times daily. 05/28/22   Gailen Shelter, PA  ciprofloxacin (CILOXAN) 0.3 % ophthalmic solution Place 2 drops into the right eye every 2 (two) hours. Administer 1 drop, every 2 hours, while awake, for 2 days. Then 1 drop, every 4 hours, while awake, for the next 5 days. 04/19/23   Smitty Knudsen, PA-C  morphine (MS CONTIN) 15 MG 12 hr tablet Take 1 tablet (15 mg total) by mouth every 12 (twelve) hours. 01/19/22   Briant Cedar, PA-C  Oxycodone HCl 10 MG TABS Take 1 tablet (10 mg total) by mouth every 4 (four) hours as needed. 01/24/22   Briant Cedar, PA-C      Allergies    Patient has no known allergies.     Review of Systems   Review of Systems  HENT:  Positive for dental problem.   All other systems reviewed and are negative.   Physical Exam Updated Vital Signs BP (!) 139/94   Pulse 86   Temp 98.5 F (36.9 C) (Oral)   Resp 20   Ht 5\' 7"  (1.702 m)   Wt 108.9 kg   SpO2 99%   BMI 37.59 kg/m  Physical Exam Vitals and nursing note reviewed.  Constitutional:      General: He is not in acute distress.    Appearance: Normal appearance. He is normal weight. He is not ill-appearing, toxic-appearing or diaphoretic.  HENT:     Head: Normocephalic and atraumatic.     Mouth/Throat:     Comments: Dentition poor throughout with multiple dental caries.  Obvious dental carry noted to the left back molar which is patient's area of tenderness.  No gross abscess or swelling under the tongue.  No signs of Ludwig's angina.  No trismus or crepitus.  Uvula midline.  Patient tolerating secretions.  No submandibular swelling. Cardiovascular:     Rate and Rhythm: Normal rate.  Pulmonary:     Effort: Pulmonary effort is normal. No respiratory distress.  Musculoskeletal:        General: Normal range of motion.     Cervical back: Normal range of  motion.  Skin:    General: Skin is warm and dry.  Neurological:     General: No focal deficit present.     Mental Status: He is alert.  Psychiatric:        Mood and Affect: Mood normal.        Behavior: Behavior normal.     ED Results / Procedures / Treatments   Labs (all labs ordered are listed, but only abnormal results are displayed) Labs Reviewed - No data to display  EKG None  Radiology No results found.  Procedures Procedures    Medications Ordered in ED Medications - No data to display  ED Course/ Medical Decision Making/ A&P                                 Medical Decision Making      This patient presents to the ED for concern of dental pain, this involves an extensive number of treatment options, and is a complaint that  carries with it a high risk of complications and morbidity.  The differential diagnosis includes dental cavity, Ludwig angina, dental abscess, peritonsillar abscess       Additional history obtained:   External records from outside source obtained and reviewed including ER notes, patient has presented previously with similar complaints and received antibiotics.    Patient presents today with complaints of dental pain x 3 days.  He is afebrile, nontoxic-appearing, and in no acute distress with reassuring vital signs.  Physical exam reveals patient tolerating secretions well without gross abscess or significant intraoral swelling.  Obvious dental cavity noted in the left lower back molar which is patient's area of pain.  No cellulitic changes noted to the face and no fluctuance or induration.    Dispostion and problem list   After consideration of the diagnostic results and the patients response to treatment, I feel that the patent would benefit from discharge with antibiotics and dental resources for dental pain, likely from a cavity. Evaluation and diagnostic testing in the emergency department does not suggest an emergent condition requiring admission or immediate intervention beyond what has been performed at this time.  Plan for discharge with close PCP follow-up.  Patient is understanding and amenable with plan, educated on red flag symptoms that would prompt immediate return.  Patient discharged in stable condition.  Final Clinical Impression(s) / ED Diagnoses Final diagnoses:  Pain, dental    Rx / DC Orders ED Discharge Orders          Ordered    penicillin v potassium (VEETID) 500 MG tablet  4 times daily        07/14/23 1031          An After Visit Summary was printed and given to the patient.     Vear Clock 07/14/23 1032    Linwood Dibbles, MD 07/19/23 [phone]

## 2023-07-14 NOTE — ED Triage Notes (Signed)
Dental pain x 3 days.  

## 2023-07-14 NOTE — Discharge Instructions (Signed)
You were seen in the emergency department for dental pain.  As we discussed I think your pain is likely related to a cavity. We normally treat this with anti-inflammatories such as Tylenol/ibuprofen, and antibiotics.   I've attached a resource guide with several dentists in the area. It's incredibly important you follow up with a dentist as soon as possible for definitive treatment.   Continue to monitor how you're doing and return to the ER for new or worsening symptoms such as difficulty swallowing your own saliva, difficulty breathing, or fever.

## 2024-09-09 ENCOUNTER — Encounter: Payer: Self-pay | Admitting: *Deleted
